# Patient Record
Sex: Female | Born: 1935 | Hispanic: No | Marital: Married | Smoking: Former smoker
Health system: Southern US, Community
[De-identification: ages and names within clinical notes are randomized; demographics above are authoritative.]

## PROBLEM LIST (undated history)

## (undated) DIAGNOSIS — C801 Malignant (primary) neoplasm, unspecified: Secondary | ICD-10-CM

## (undated) DIAGNOSIS — M1990 Unspecified osteoarthritis, unspecified site: Secondary | ICD-10-CM

## (undated) DIAGNOSIS — E039 Hypothyroidism, unspecified: Secondary | ICD-10-CM

## (undated) DIAGNOSIS — I671 Cerebral aneurysm, nonruptured: Secondary | ICD-10-CM

## (undated) DIAGNOSIS — I10 Essential (primary) hypertension: Secondary | ICD-10-CM

## (undated) DIAGNOSIS — K219 Gastro-esophageal reflux disease without esophagitis: Secondary | ICD-10-CM

## (undated) DIAGNOSIS — N3090 Cystitis, unspecified without hematuria: Secondary | ICD-10-CM

## (undated) HISTORY — PX: OTHER SURGICAL HISTORY: SHX169

## (undated) HISTORY — PX: WISDOM TOOTH EXTRACTION: SHX21

## (undated) HISTORY — PX: CHOLECYSTECTOMY: SHX55

---

## 1998-01-16 ENCOUNTER — Observation Stay (HOSPITAL_COMMUNITY): Admission: RE | Admit: 1998-01-16 | Discharge: 1998-01-17 | Payer: Self-pay | Admitting: General Surgery

## 1998-08-20 ENCOUNTER — Other Ambulatory Visit: Admission: RE | Admit: 1998-08-20 | Discharge: 1998-08-20 | Payer: Self-pay | Admitting: Internal Medicine

## 2001-11-24 ENCOUNTER — Emergency Department (HOSPITAL_COMMUNITY): Admission: EM | Admit: 2001-11-24 | Discharge: 2001-11-24 | Payer: Self-pay | Admitting: *Deleted

## 2001-12-07 ENCOUNTER — Ambulatory Visit (HOSPITAL_COMMUNITY): Admission: RE | Admit: 2001-12-07 | Discharge: 2001-12-07 | Payer: Self-pay | Admitting: Internal Medicine

## 2001-12-07 ENCOUNTER — Encounter: Payer: Self-pay | Admitting: Internal Medicine

## 2001-12-20 ENCOUNTER — Encounter: Payer: Self-pay | Admitting: Internal Medicine

## 2001-12-20 ENCOUNTER — Ambulatory Visit (HOSPITAL_COMMUNITY): Admission: RE | Admit: 2001-12-20 | Discharge: 2001-12-20 | Payer: Self-pay | Admitting: Internal Medicine

## 2002-04-21 ENCOUNTER — Ambulatory Visit (HOSPITAL_COMMUNITY): Admission: RE | Admit: 2002-04-21 | Discharge: 2002-04-21 | Payer: Self-pay | Admitting: Internal Medicine

## 2002-04-21 ENCOUNTER — Encounter: Payer: Self-pay | Admitting: Internal Medicine

## 2002-04-25 ENCOUNTER — Encounter: Admission: RE | Admit: 2002-04-25 | Discharge: 2002-04-25 | Payer: Self-pay | Admitting: Neurosurgery

## 2002-04-25 ENCOUNTER — Encounter: Payer: Self-pay | Admitting: Neurosurgery

## 2003-02-02 ENCOUNTER — Encounter (INDEPENDENT_AMBULATORY_CARE_PROVIDER_SITE_OTHER): Payer: Self-pay | Admitting: Specialist

## 2003-02-02 ENCOUNTER — Ambulatory Visit (HOSPITAL_COMMUNITY): Admission: RE | Admit: 2003-02-02 | Discharge: 2003-02-02 | Payer: Self-pay | Admitting: Gastroenterology

## 2004-07-08 ENCOUNTER — Encounter: Admission: RE | Admit: 2004-07-08 | Discharge: 2004-07-08 | Payer: Self-pay | Admitting: Internal Medicine

## 2004-07-17 ENCOUNTER — Other Ambulatory Visit: Admission: RE | Admit: 2004-07-17 | Discharge: 2004-07-17 | Payer: Self-pay | Admitting: Internal Medicine

## 2004-09-05 ENCOUNTER — Encounter: Admission: RE | Admit: 2004-09-05 | Discharge: 2004-09-05 | Payer: Self-pay | Admitting: Internal Medicine

## 2005-05-22 ENCOUNTER — Encounter: Admission: RE | Admit: 2005-05-22 | Discharge: 2005-05-22 | Payer: Self-pay | Admitting: Endocrinology

## 2006-06-24 ENCOUNTER — Encounter: Admission: RE | Admit: 2006-06-24 | Discharge: 2006-06-24 | Payer: Self-pay | Admitting: Endocrinology

## 2006-12-29 ENCOUNTER — Encounter: Admission: RE | Admit: 2006-12-29 | Discharge: 2006-12-29 | Payer: Self-pay | Admitting: Endocrinology

## 2008-02-22 ENCOUNTER — Encounter: Admission: RE | Admit: 2008-02-22 | Discharge: 2008-02-22 | Payer: Self-pay | Admitting: Endocrinology

## 2009-09-05 ENCOUNTER — Encounter: Admission: RE | Admit: 2009-09-05 | Discharge: 2009-09-05 | Payer: Self-pay | Admitting: Endocrinology

## 2010-03-30 ENCOUNTER — Ambulatory Visit (HOSPITAL_COMMUNITY)
Admission: RE | Admit: 2010-03-30 | Discharge: 2010-03-30 | Payer: Self-pay | Source: Home / Self Care | Attending: Family Medicine | Admitting: Family Medicine

## 2010-04-13 ENCOUNTER — Encounter: Payer: Self-pay | Admitting: Endocrinology

## 2010-04-23 ENCOUNTER — Encounter: Payer: Self-pay | Admitting: Cardiology

## 2010-08-08 NOTE — Op Note (Signed)
NAME:  Rachel Pierce, Rachel Pierce                       ACCOUNT NO.:  [id number]   MEDICAL RECORD NO.:  [id number]                   PATIENT TYPE:  AMB   LOCATION:  ENDO                                 FACILITY:  [facility]   PHYSICIAN:  Danise Edge, M.D.                DATE OF BIRTH:  [birth year]   DATE OF PROCEDURE:  02/02/2003  DATE OF DISCHARGE:                                 OPERATIVE REPORT   PROCEDURE:  Screening colonoscopy with sigmoid polypectomy.   INDICATIONS:  Mrs. Rachel Pierce is a 76 year old female, born June 07, 1934.  Mrs. Rachel Pierce is scheduled to undergo her first screening colonoscopy  with polypectomy to prevent colon cancer.   ENDOSCOPIST:  Danise Edge, M.D.   PREMEDICATION:  Demerol 50 mg, Versed 7 mg.   DESCRIPTION OF PROCEDURE:  After obtaining informed consent, Mrs. Rachel Pierce was  placed in the left lateral decubitus position.  I administered intravenous  Demerol and intravenous Versed to achieve conscious sedation for the  procedure.  The patient's blood pressure, oxygen saturation and cardiac  rhythm were monitored throughout the procedure and documented in the medical  record.   Anal inspection was normal.  Digital rectal exam was normal.  The Olympus  adjustable pediatric  colonoscope was introduced into the rectum and  advanced to the cecum.  Colonic preparation for the exam today was  excellent.  Rectum:  Normal.  Sigmoid colon and descending colon:  Extensive left colonic diverticulosis.  At approximately 20 cm from the anal verge, from the distal sigmoid colon, a  1 cm pedunculated polyp was removed with the electrocautery snare.  Splenic flexure:  Normal.  Transverse colon:  Normal.  Hepatic flexure:  Normal.  Ascending colon:  Normal.  Cecum and ileocecal valve:  Normal.   ASSESSMENT:  1. Extensive left colonic diverticulosis.  2. From the distal sigmoid colon, a 1 cm pedunculated polyp was removed.     Danise Edge, M.D.   MJ/MEDQ  D:  02/02/2003  T:  02/02/2003  Job:  [id number]   cc:   Ike Bene, M.D.  [address]  [place]  [place] [address]  Fax: [phone]

## 2011-02-16 ENCOUNTER — Other Ambulatory Visit: Payer: Self-pay | Admitting: Dermatology

## 2011-02-19 IMAGING — US US SOFT TISSUE HEAD/NECK
1 series · 14 of 25 positions shown · non-contrast
Comparison: 02/22/2008 and earlier.

CLINICAL DATA: 75-year-old female with history of thyroid goiter
and small nodule.

THYROID ULTRASOUND
TECHNIQUE: Ultrasound examination of the thyroid gland and adjacent
soft tissues was performed.

[Series 1: us soft tissue head/neck · 0.06mm/px · 14 of 32 slices shown]
[im 1/32]
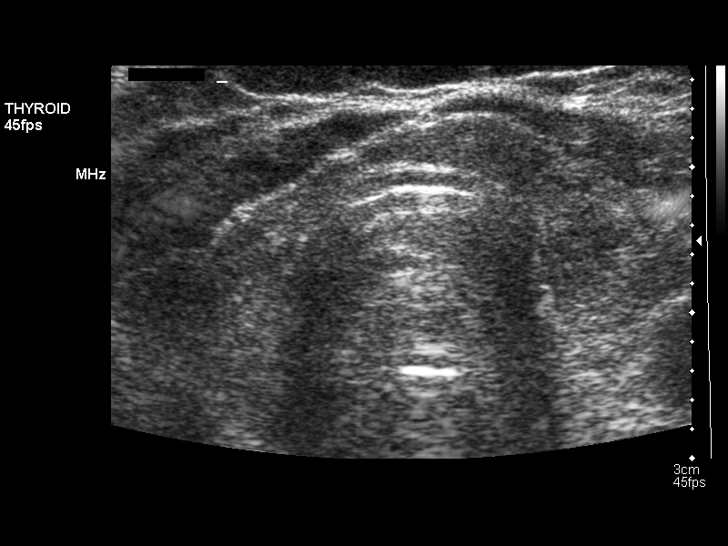
[im 3/32]
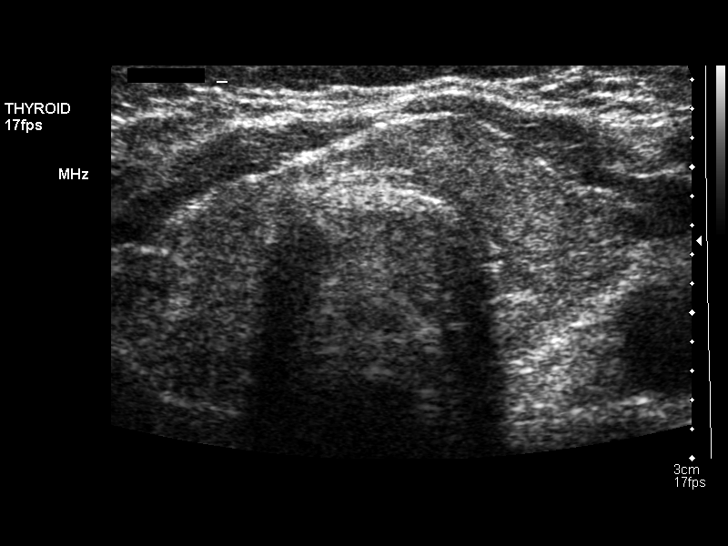
[im 6/32]
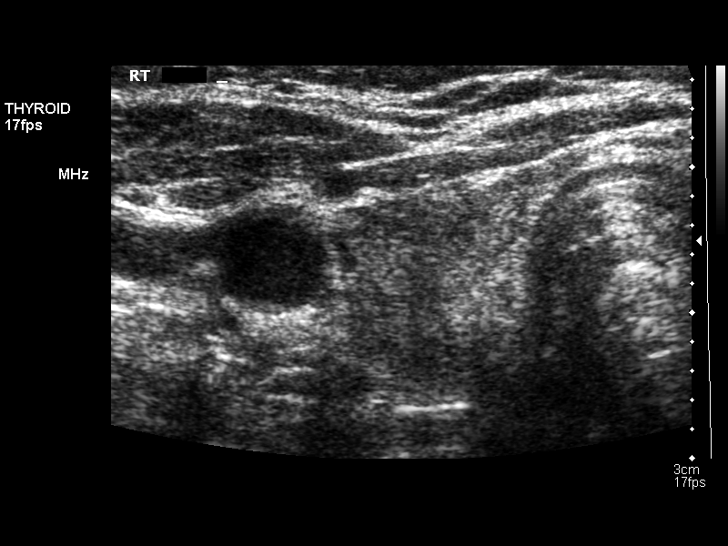
[im 8/32]
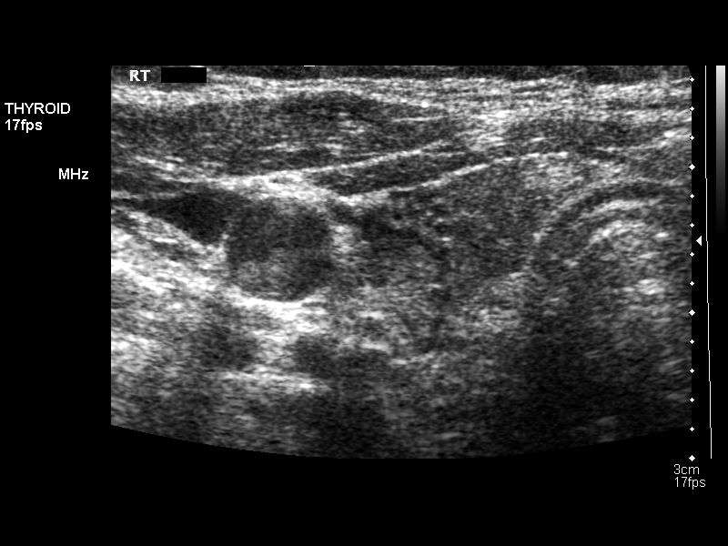
[im 11/32]
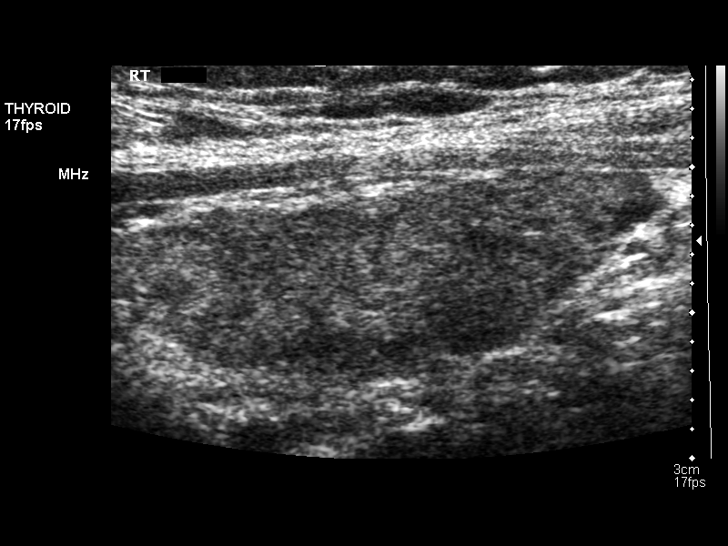
[im 12/32]
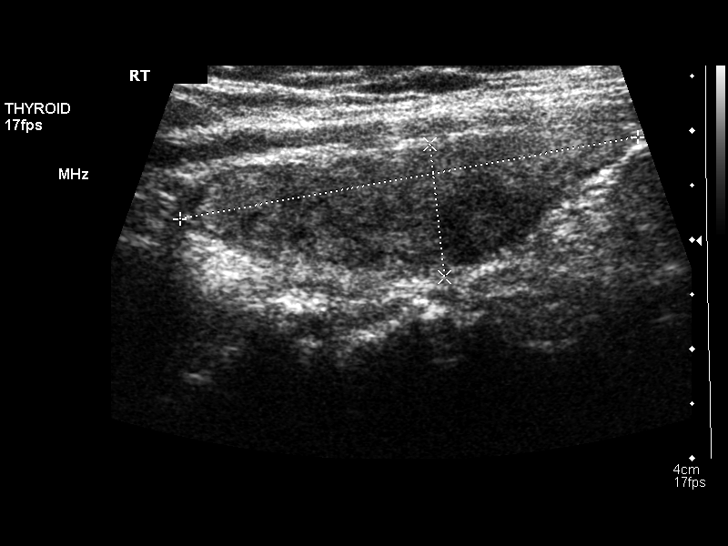
[im 15/32]
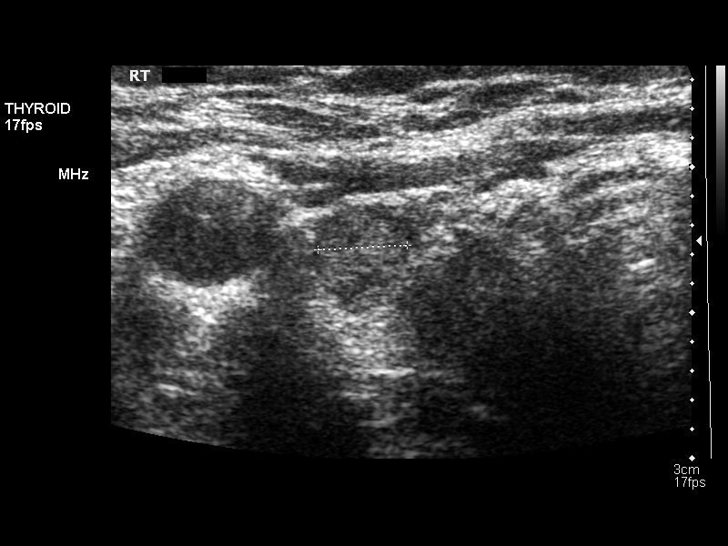
[im 17/32]
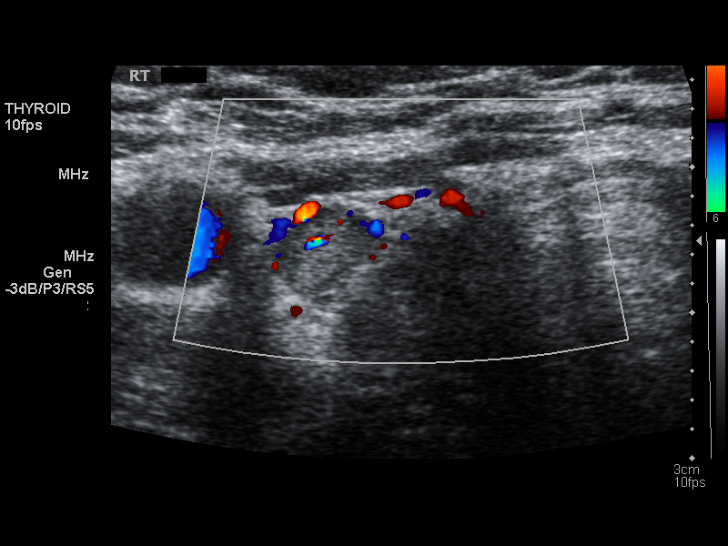
[im 20/32]
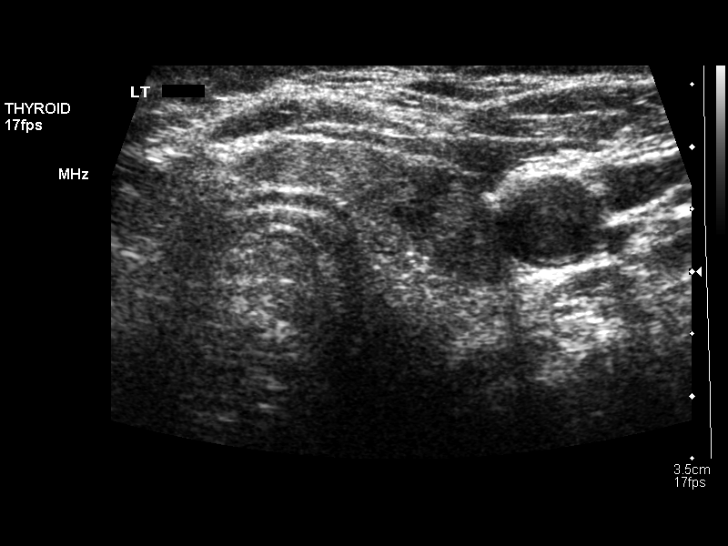
[im 21/32]
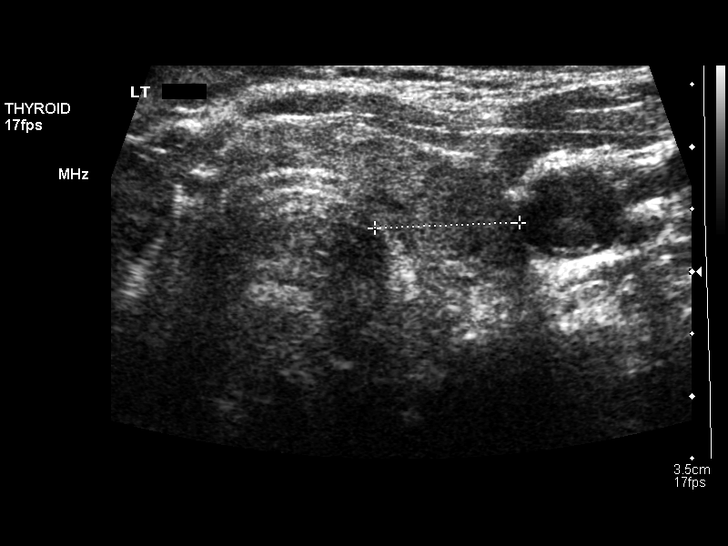
[im 24/32]
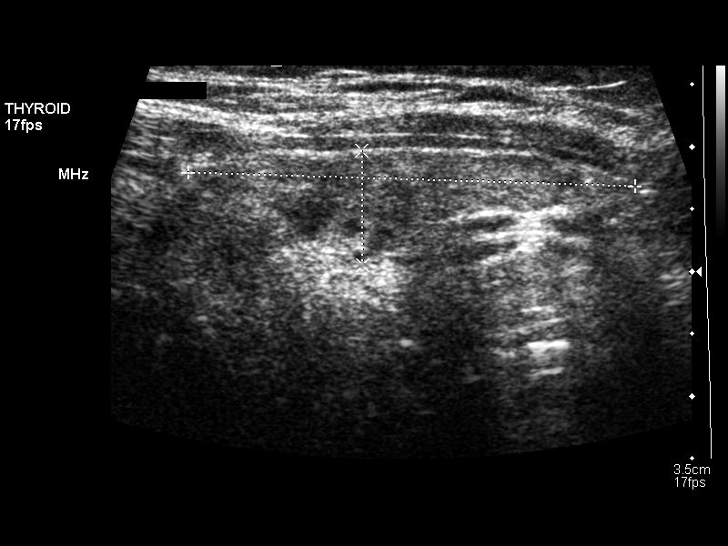
[im 26/32]
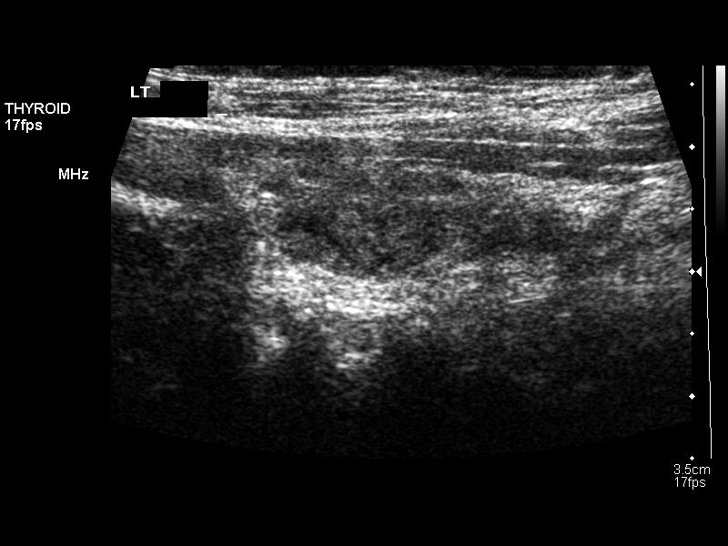
[im 29/32]
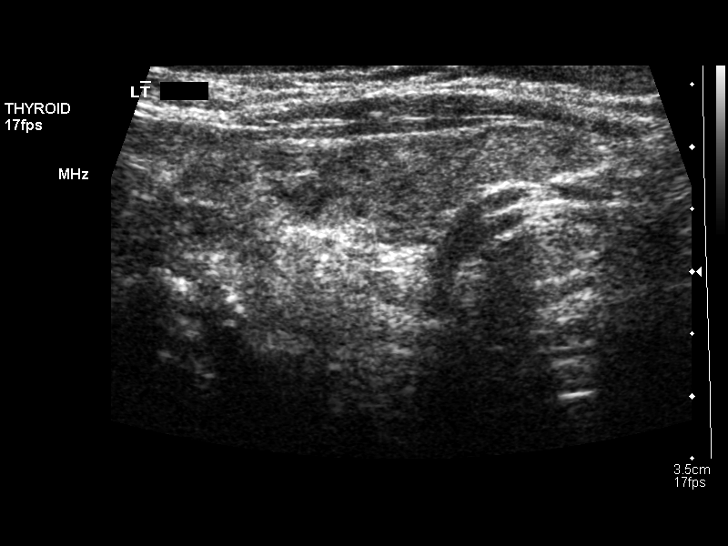
[im 32/32]
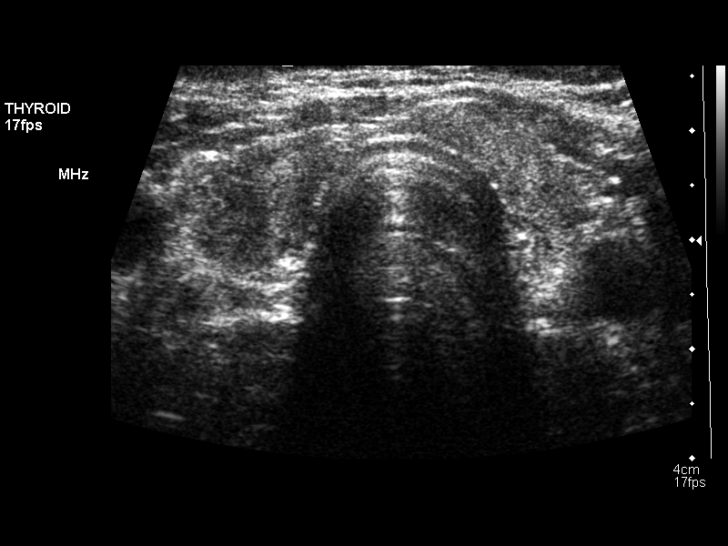

[14 of 25 positions shown; findings below may reference images not displayed]

FINDINGS: Right thyroid lobe:  4.5 x 1.3 x 1.4 cm (previously 4.5 x 1.3 x
cm).  Mildly heterogeneous echotexture.
Left thyroid lobe:  3.6 x 0.9 x 1.2 cm (previously 4.0 x 1.3 x
cm). Mildly heterogeneous echotexture.
Isthmus:  3 mm (unchanged).

Focal nodules:  Stable subtle mostly hyperechoic nodule in the
upper pole of the right lobe measuring 8 x 5 x 6 mm (previously 8 x
5 x 7 mm).

Lymphadenopathy:  Absent
IMPRESSION: Stable size and appearance of the thyroid.  No significant nodule
or mass.

## 2013-08-03 ENCOUNTER — Other Ambulatory Visit: Payer: Self-pay | Admitting: Orthopedic Surgery

## 2013-08-03 DIAGNOSIS — R531 Weakness: Secondary | ICD-10-CM

## 2013-08-03 DIAGNOSIS — M25562 Pain in left knee: Secondary | ICD-10-CM

## 2013-08-10 ENCOUNTER — Ambulatory Visit
Admission: RE | Admit: 2013-08-10 | Discharge: 2013-08-10 | Disposition: A | Discharge status: Home / Self Care | Payer: Medicare Other | Source: Ambulatory Visit | Attending: Orthopedic Surgery | Admitting: Orthopedic Surgery

## 2013-08-10 DIAGNOSIS — R531 Weakness: Secondary | ICD-10-CM

## 2013-08-10 DIAGNOSIS — M25562 Pain in left knee: Secondary | ICD-10-CM

## 2014-01-24 ENCOUNTER — Other Ambulatory Visit: Payer: Self-pay

## 2014-04-02 DIAGNOSIS — M1712 Unilateral primary osteoarthritis, left knee: Secondary | ICD-10-CM | POA: Diagnosis not present

## 2014-05-14 DIAGNOSIS — M859 Disorder of bone density and structure, unspecified: Secondary | ICD-10-CM | POA: Diagnosis not present

## 2014-05-14 DIAGNOSIS — E1121 Type 2 diabetes mellitus with diabetic nephropathy: Secondary | ICD-10-CM | POA: Diagnosis not present

## 2014-05-14 DIAGNOSIS — I10 Essential (primary) hypertension: Secondary | ICD-10-CM | POA: Diagnosis not present

## 2014-05-21 DIAGNOSIS — E1121 Type 2 diabetes mellitus with diabetic nephropathy: Secondary | ICD-10-CM | POA: Diagnosis not present

## 2014-05-21 DIAGNOSIS — I10 Essential (primary) hypertension: Secondary | ICD-10-CM | POA: Diagnosis not present

## 2014-05-21 DIAGNOSIS — N182 Chronic kidney disease, stage 2 (mild): Secondary | ICD-10-CM | POA: Diagnosis not present

## 2014-05-21 DIAGNOSIS — E785 Hyperlipidemia, unspecified: Secondary | ICD-10-CM | POA: Diagnosis not present

## 2014-07-13 DIAGNOSIS — M1712 Unilateral primary osteoarthritis, left knee: Secondary | ICD-10-CM | POA: Diagnosis not present

## 2014-07-25 ENCOUNTER — Other Ambulatory Visit (HOSPITAL_COMMUNITY): Payer: Self-pay | Admitting: Orthopedic Surgery

## 2014-07-26 ENCOUNTER — Encounter (HOSPITAL_COMMUNITY)
Admission: RE | Admit: 2014-07-26 | Discharge: 2014-07-26 | Disposition: A | Discharge status: Home / Self Care | Payer: Medicare Other | Source: Ambulatory Visit | Attending: Orthopedic Surgery | Admitting: Orthopedic Surgery

## 2014-07-26 ENCOUNTER — Encounter (HOSPITAL_COMMUNITY): Payer: Self-pay

## 2014-07-26 ENCOUNTER — Ambulatory Visit (HOSPITAL_COMMUNITY)
Admission: RE | Admit: 2014-07-26 | Discharge: 2014-07-26 | Disposition: A | Discharge status: Home / Self Care | Payer: Medicare Other | Source: Ambulatory Visit | Attending: Orthopedic Surgery | Admitting: Orthopedic Surgery

## 2014-07-26 DIAGNOSIS — M179 Osteoarthritis of knee, unspecified: Secondary | ICD-10-CM | POA: Diagnosis not present

## 2014-07-26 DIAGNOSIS — I517 Cardiomegaly: Secondary | ICD-10-CM | POA: Diagnosis not present

## 2014-07-26 DIAGNOSIS — Z85820 Personal history of malignant melanoma of skin: Secondary | ICD-10-CM | POA: Diagnosis not present

## 2014-07-26 DIAGNOSIS — Z87891 Personal history of nicotine dependence: Secondary | ICD-10-CM | POA: Diagnosis not present

## 2014-07-26 DIAGNOSIS — E039 Hypothyroidism, unspecified: Secondary | ICD-10-CM | POA: Diagnosis not present

## 2014-07-26 DIAGNOSIS — K219 Gastro-esophageal reflux disease without esophagitis: Secondary | ICD-10-CM | POA: Diagnosis not present

## 2014-07-26 DIAGNOSIS — Z01811 Encounter for preprocedural respiratory examination: Secondary | ICD-10-CM | POA: Diagnosis not present

## 2014-07-26 DIAGNOSIS — I10 Essential (primary) hypertension: Secondary | ICD-10-CM | POA: Diagnosis not present

## 2014-07-26 DIAGNOSIS — M549 Dorsalgia, unspecified: Secondary | ICD-10-CM | POA: Diagnosis not present

## 2014-07-26 DIAGNOSIS — M1710 Unilateral primary osteoarthritis, unspecified knee: Secondary | ICD-10-CM

## 2014-07-26 DIAGNOSIS — M96672 Fracture of tibia or fibula following insertion of orthopedic implant, joint prosthesis, or bone plate, left leg: Secondary | ICD-10-CM | POA: Diagnosis not present

## 2014-07-26 HISTORY — DX: Essential (primary) hypertension: I10

## 2014-07-26 HISTORY — DX: Unspecified osteoarthritis, unspecified site: M19.90

## 2014-07-26 HISTORY — DX: Hypothyroidism, unspecified: E03.9

## 2014-07-26 HISTORY — DX: Malignant (primary) neoplasm, unspecified: C80.1

## 2014-07-26 HISTORY — DX: Cystitis, unspecified without hematuria: N30.90

## 2014-07-26 HISTORY — DX: Cerebral aneurysm, nonruptured: I67.1

## 2014-07-26 HISTORY — DX: Gastro-esophageal reflux disease without esophagitis: K21.9

## 2014-07-26 LAB — CBC
HCT: 43 % (ref 36.0–46.0)
HEMOGLOBIN: 14.4 g/dL (ref 12.0–15.0)
MCH: 30.5 pg (ref 26.0–34.0)
MCHC: 33.5 g/dL (ref 30.0–36.0)
MCV: 91.1 fL (ref 78.0–100.0)
Platelets: 222 10*3/uL (ref 150–400)
RBC: 4.72 MIL/uL (ref 3.87–5.11)
RDW: 13 % (ref 11.5–15.5)
WBC: 8.3 10*3/uL (ref 4.0–10.5)

## 2014-07-26 LAB — COMPREHENSIVE METABOLIC PANEL
ALK PHOS: 66 U/L (ref 38–126)
ALT: 16 U/L (ref 14–54)
AST: 19 U/L (ref 15–41)
Albumin: 4.3 g/dL (ref 3.5–5.0)
Anion gap: 11 (ref 5–15)
BUN: 18 mg/dL (ref 6–20)
CALCIUM: 9.7 mg/dL (ref 8.9–10.3)
CO2: 24 mmol/L (ref 22–32)
Chloride: 108 mmol/L (ref 101–111)
Creatinine, Ser: 0.8 mg/dL (ref 0.44–1.00)
GLUCOSE: 96 mg/dL (ref 70–99)
Potassium: 4.2 mmol/L (ref 3.5–5.1)
Sodium: 143 mmol/L (ref 135–145)
TOTAL PROTEIN: 7.1 g/dL (ref 6.5–8.1)
Total Bilirubin: 0.4 mg/dL (ref 0.3–1.2)

## 2014-07-26 LAB — SURGICAL PCR SCREEN
MRSA, PCR: NEGATIVE
Staphylococcus aureus: POSITIVE — AB

## 2014-07-26 LAB — APTT: aPTT: 33 seconds (ref 24–37)

## 2014-07-26 LAB — PROTIME-INR
INR: 1.03 (ref 0.00–1.49)
Prothrombin Time: 13.6 seconds (ref 11.6–15.2)

## 2014-07-26 NOTE — Pre-Procedure Instructions (Signed)
Rachel Pierce  07/26/2014   Your procedure is scheduled on:    Wednesday  08/01/14  Report to [facility] Admitting at 820 AM.  Call this number if you have problems the morning of surgery: [phone]   Remember:   Do not eat food or drink liquids after midnight.   Take these medicines the morning of surgery with A SIP OF WATER:   TYLENOL IF NEEDED, LEVOTHYROXINE, METOPROLOL(TOPROL), OMEPRAZOLE (PRILOSEC)  STOP MULTIVITAMIN AND ANY HERBAL MEDICINES   Do not wear jewelry, make-up or nail polish.  Do not wear lotions, powders, or perfumes. You may wear deodorant.  Do not shave 48 hours prior to surgery. Men may shave face and neck.  Do not bring valuables to the hospital.  [facility] is not responsible                  for any belongings or valuables.               Contacts, dentures or bridgework may not be worn into surgery.  Leave suitcase in the car. After surgery it may be brought to your room.  For patients admitted to the hospital, discharge time is determined by your                treatment team.               Patients discharged the day of surgery will not be allowed to drive  home.  Name and phone number of your driver:   Special Instructions: [facility] - Preparing for Surgery  Before surgery, you can play an important role.  Because skin is not sterile, your skin needs to be as free of germs as possible.  You can reduce the number of germs on you skin by washing with CHG (chlorahexidine gluconate) soap before surgery.  CHG is an antiseptic cleaner which kills germs and bonds with the skin to continue killing germs even after washing.  Please DO NOT use if you have an allergy to CHG or antibacterial soaps.  If your skin becomes reddened/irritated stop using the CHG and inform your nurse when you arrive at Short Stay.  Do not shave (including legs and underarms) for at least 48 hours prior to the first CHG shower.  You may shave your face.  Please follow these  instructions carefully:   1.  Shower with CHG Soap the night before surgery and the                                morning of Surgery.  2.  If you choose to wash your hair, wash your hair first as usual with your       normal shampoo.  3.  After you shampoo, rinse your hair and body thoroughly to remove the                      Shampoo.  4.  Use CHG as you would any other liquid soap.  You can apply chg directly       to the skin and wash gently with scrungie or a clean washcloth.  5.  Apply the CHG Soap to your body ONLY FROM THE NECK DOWN.        Do not use on open wounds or open sores.  Avoid contact with your eyes,       ears, mouth  and genitals (private parts).  Wash genitals (private parts)       with your normal soap.  6.  Wash thoroughly, paying special attention to the area where your surgery        will be performed.  7.  Thoroughly rinse your body with warm water from the neck down.  8.  DO NOT shower/wash with your normal soap after using and rinsing off       the CHG Soap.  9.  Pat yourself dry with a clean towel.            10.  Wear clean pajamas.            11.  Place clean sheets on your bed the night of your first shower and do not        sleep with pets.  Day of Surgery  Do not apply any lotions/deoderants the morning of surgery.  Please wear clean clothes to the hospital/surgery center.     Please read over the following fact sheets that you were given: Pain Booklet, Coughing and Deep Breathing, Total Joint Packet, MRSA Information and Surgical Site Infection Prevention

## 2014-07-31 MED ORDER — CEFAZOLIN SODIUM-DEXTROSE 2-3 GM-% IV SOLR
2.0000 g | INTRAVENOUS | Status: AC
  Administered 2014-08-01: 2 g via INTRAVENOUS

## 2014-07-31 NOTE — Progress Notes (Signed)
Pt notified of time change.New arrival time of 0630.Pt verbalized understanding.

## 2014-08-01 ENCOUNTER — Inpatient Hospital Stay (HOSPITAL_COMMUNITY): Payer: Medicare Other

## 2014-08-01 ENCOUNTER — Encounter (HOSPITAL_COMMUNITY)
Admission: RE | Disposition: A | Discharge status: Home Health | Payer: Self-pay | Source: Ambulatory Visit | Attending: Orthopedic Surgery

## 2014-08-01 ENCOUNTER — Inpatient Hospital Stay (HOSPITAL_COMMUNITY): Payer: Medicare Other | Admitting: Anesthesiology

## 2014-08-01 ENCOUNTER — Inpatient Hospital Stay (HOSPITAL_COMMUNITY)
Admission: RE | Admit: 2014-08-01 | Discharge: 2014-08-04 | DRG: 470 | Disposition: A | Discharge status: Home Health | Payer: Medicare Other | Source: Ambulatory Visit | Attending: Orthopedic Surgery | Admitting: Orthopedic Surgery

## 2014-08-01 ENCOUNTER — Encounter (HOSPITAL_COMMUNITY): Payer: Self-pay | Admitting: *Deleted

## 2014-08-01 DIAGNOSIS — M96672 Fracture of tibia or fibula following insertion of orthopedic implant, joint prosthesis, or bone plate, left leg: Secondary | ICD-10-CM | POA: Diagnosis not present

## 2014-08-01 DIAGNOSIS — M179 Osteoarthritis of knee, unspecified: Secondary | ICD-10-CM | POA: Diagnosis not present

## 2014-08-01 DIAGNOSIS — Z87891 Personal history of nicotine dependence: Secondary | ICD-10-CM | POA: Diagnosis not present

## 2014-08-01 DIAGNOSIS — Z471 Aftercare following joint replacement surgery: Secondary | ICD-10-CM | POA: Diagnosis not present

## 2014-08-01 DIAGNOSIS — E039 Hypothyroidism, unspecified: Secondary | ICD-10-CM | POA: Diagnosis not present

## 2014-08-01 DIAGNOSIS — I10 Essential (primary) hypertension: Secondary | ICD-10-CM | POA: Diagnosis present

## 2014-08-01 DIAGNOSIS — Z85820 Personal history of malignant melanoma of skin: Secondary | ICD-10-CM | POA: Diagnosis not present

## 2014-08-01 DIAGNOSIS — K219 Gastro-esophageal reflux disease without esophagitis: Secondary | ICD-10-CM | POA: Diagnosis present

## 2014-08-01 DIAGNOSIS — Y838 Other surgical procedures as the cause of abnormal reaction of the patient, or of later complication, without mention of misadventure at the time of the procedure: Secondary | ICD-10-CM | POA: Diagnosis not present

## 2014-08-01 DIAGNOSIS — M1712 Unilateral primary osteoarthritis, left knee: Secondary | ICD-10-CM | POA: Diagnosis not present

## 2014-08-01 DIAGNOSIS — M549 Dorsalgia, unspecified: Secondary | ICD-10-CM | POA: Diagnosis present

## 2014-08-01 DIAGNOSIS — G8918 Other acute postprocedural pain: Secondary | ICD-10-CM | POA: Diagnosis not present

## 2014-08-01 DIAGNOSIS — Z96652 Presence of left artificial knee joint: Secondary | ICD-10-CM | POA: Diagnosis not present

## 2014-08-01 DIAGNOSIS — S82132A Displaced fracture of medial condyle of left tibia, initial encounter for closed fracture: Secondary | ICD-10-CM | POA: Diagnosis not present

## 2014-08-01 DIAGNOSIS — Z96659 Presence of unspecified artificial knee joint: Secondary | ICD-10-CM

## 2014-08-01 HISTORY — PX: TOTAL KNEE ARTHROPLASTY: SHX125

## 2014-08-01 SURGERY — ARTHROPLASTY, KNEE, TOTAL
Anesthesia: General | Laterality: Left

## 2014-08-01 MED ORDER — MIDAZOLAM HCL 5 MG/5ML IJ SOLN
INTRAMUSCULAR | Status: DC | PRN
  Administered 2014-08-01: 1 mg via INTRAVENOUS

## 2014-08-01 MED ORDER — LIDOCAINE HCL (CARDIAC) 20 MG/ML IV SOLN
INTRAVENOUS | Status: DC | PRN
  Administered 2014-08-01: 80 mg via INTRAVENOUS

## 2014-08-01 MED ORDER — LEVOTHYROXINE SODIUM 88 MCG PO TABS
88.0000 ug | ORAL_TABLET | Freq: Every day | ORAL | Status: DC
  Administered 2014-08-02 – 2014-08-04 (×3): 88 ug via ORAL
  Filled 2014-08-01 (×5): qty 1

## 2014-08-01 MED ORDER — HYDROCODONE-ACETAMINOPHEN 5-325 MG PO TABS: 1.0000 | ORAL_TABLET | Freq: Four times a day (QID) | ORAL | Status: DC | PRN

## 2014-08-01 MED ORDER — KETOROLAC TROMETHAMINE 15 MG/ML IJ SOLN
7.5000 mg | Freq: Four times a day (QID) | INTRAMUSCULAR | Status: AC
  Administered 2014-08-01 – 2014-08-02 (×4): 7.5 mg via INTRAVENOUS
  Filled 2014-08-01 (×3): qty 1

## 2014-08-01 MED ORDER — HYDROMORPHONE HCL 1 MG/ML IJ SOLN
0.2500 mg | INTRAMUSCULAR | Status: DC | PRN
  Administered 2014-08-01 (×4): 0.5 mg via INTRAVENOUS

## 2014-08-01 MED ORDER — METHOCARBAMOL 1000 MG/10ML IJ SOLN
500.0000 mg | INTRAMUSCULAR | Status: AC
  Administered 2014-08-01: 500 mg via INTRAVENOUS
  Filled 2014-08-01: qty 5

## 2014-08-01 MED ORDER — ONDANSETRON HCL 4 MG/2ML IJ SOLN
INTRAMUSCULAR | Status: DC | PRN
  Administered 2014-08-01: 4 mg via INTRAVENOUS

## 2014-08-01 MED ORDER — METOCLOPRAMIDE HCL 5 MG PO TABS
5.0000 mg | ORAL_TABLET | Freq: Three times a day (TID) | ORAL | Status: DC | PRN
  Administered 2014-08-01: 10 mg via ORAL
  Filled 2014-08-01: qty 2

## 2014-08-01 MED ORDER — FENTANYL CITRATE (PF) 250 MCG/5ML IJ SOLN
INTRAMUSCULAR | Status: AC
  Filled 2014-08-01: qty 5

## 2014-08-01 MED ORDER — SODIUM CHLORIDE 0.9 % IJ SOLN
INTRAMUSCULAR | Status: AC
Start: 2014-08-01 — End: 2014-08-01
  Filled 2014-08-01: qty 10

## 2014-08-01 MED ORDER — METOCLOPRAMIDE HCL 5 MG/ML IJ SOLN: 5.0000 mg | Freq: Three times a day (TID) | INTRAMUSCULAR | Status: DC | PRN

## 2014-08-01 MED ORDER — ROPIVACAINE HCL 5 MG/ML IJ SOLN
INTRAMUSCULAR | Status: DC | PRN
  Administered 2014-08-01: 20 mL via PERINEURAL

## 2014-08-01 MED ORDER — BUPIVACAINE LIPOSOME 1.3 % IJ SUSP
20.0000 mL | INTRAMUSCULAR | Status: AC
  Administered 2014-08-01: 20 mL
  Filled 2014-08-01: qty 20

## 2014-08-01 MED ORDER — ONDANSETRON HCL 4 MG/2ML IJ SOLN
4.0000 mg | Freq: Four times a day (QID) | INTRAMUSCULAR | Status: DC | PRN
  Administered 2014-08-01: 4 mg via INTRAVENOUS
  Filled 2014-08-01: qty 2

## 2014-08-01 MED ORDER — METOPROLOL SUCCINATE ER 25 MG PO TB24
12.5000 mg | ORAL_TABLET | Freq: Every day | ORAL | Status: DC
  Administered 2014-08-02 – 2014-08-04 (×3): 12.5 mg via ORAL
  Filled 2014-08-01 (×3): qty 1

## 2014-08-01 MED ORDER — DEXTROSE 5 % IV SOLN
500.0000 mg | Freq: Four times a day (QID) | INTRAVENOUS | Status: DC | PRN
  Filled 2014-08-01: qty 5

## 2014-08-01 MED ORDER — BISACODYL 5 MG PO TBEC: 5.0000 mg | DELAYED_RELEASE_TABLET | Freq: Every day | ORAL | Status: DC | PRN

## 2014-08-01 MED ORDER — ARTIFICIAL TEARS OP OINT
TOPICAL_OINTMENT | OPHTHALMIC | Status: DC | PRN
  Administered 2014-08-01: 1 via OPHTHALMIC

## 2014-08-01 MED ORDER — ASPIRIN EC 325 MG PO TBEC: 325.0000 mg | DELAYED_RELEASE_TABLET | Freq: Every day | ORAL | Status: DC

## 2014-08-01 MED ORDER — PANTOPRAZOLE SODIUM 40 MG PO TBEC
40.0000 mg | DELAYED_RELEASE_TABLET | Freq: Every day | ORAL | Status: DC
  Administered 2014-08-01: 40 mg via ORAL
  Filled 2014-08-01: qty 1

## 2014-08-01 MED ORDER — ONDANSETRON HCL 4 MG/2ML IJ SOLN
INTRAMUSCULAR | Status: AC
  Filled 2014-08-01: qty 2

## 2014-08-01 MED ORDER — HYDROMORPHONE HCL 1 MG/ML IJ SOLN
INTRAMUSCULAR | Status: AC
  Filled 2014-08-01: qty 1

## 2014-08-01 MED ORDER — PROMETHAZINE HCL 25 MG/ML IJ SOLN
6.2500 mg | INTRAMUSCULAR | Status: DC | PRN
Start: 2014-08-01 — End: 2014-08-01

## 2014-08-01 MED ORDER — ACETAMINOPHEN 650 MG RE SUPP: 650.0000 mg | Freq: Four times a day (QID) | RECTAL | Status: DC | PRN

## 2014-08-01 MED ORDER — KETOROLAC TROMETHAMINE 15 MG/ML IJ SOLN
INTRAMUSCULAR | Status: AC
  Filled 2014-08-01: qty 1

## 2014-08-01 MED ORDER — ROCURONIUM BROMIDE 50 MG/5ML IV SOLN
INTRAVENOUS | Status: AC
  Filled 2014-08-01: qty 1

## 2014-08-01 MED ORDER — PHENOL 1.4 % MT LIQD: 1.0000 | OROMUCOSAL | Status: DC | PRN

## 2014-08-01 MED ORDER — OXYCODONE HCL 5 MG PO TABS
5.0000 mg | ORAL_TABLET | ORAL | Status: DC | PRN
  Administered 2014-08-01 – 2014-08-04 (×13): 10 mg via ORAL
  Filled 2014-08-01 (×14): qty 2

## 2014-08-01 MED ORDER — PROPOFOL 10 MG/ML IV BOLUS
INTRAVENOUS | Status: AC
  Filled 2014-08-01: qty 20

## 2014-08-01 MED ORDER — ACETAMINOPHEN 325 MG PO TABS
650.0000 mg | ORAL_TABLET | Freq: Four times a day (QID) | ORAL | Status: DC | PRN
  Administered 2014-08-02: 650 mg via ORAL
  Filled 2014-08-01: qty 2

## 2014-08-01 MED ORDER — MIDAZOLAM HCL 2 MG/2ML IJ SOLN
INTRAMUSCULAR | Status: AC
  Filled 2014-08-01: qty 2

## 2014-08-01 MED ORDER — MENTHOL 3 MG MT LOZG: 1.0000 | LOZENGE | OROMUCOSAL | Status: DC | PRN

## 2014-08-01 MED ORDER — SUCCINYLCHOLINE CHLORIDE 20 MG/ML IJ SOLN
INTRAMUSCULAR | Status: AC
  Filled 2014-08-01: qty 1

## 2014-08-01 MED ORDER — SENNOSIDES-DOCUSATE SODIUM 8.6-50 MG PO TABS: 1.0000 | ORAL_TABLET | Freq: Every evening | ORAL | Status: DC | PRN

## 2014-08-01 MED ORDER — EPHEDRINE SULFATE 50 MG/ML IJ SOLN
INTRAMUSCULAR | Status: DC | PRN
  Administered 2014-08-01: 5 mg via INTRAVENOUS
  Administered 2014-08-01: 10 mg via INTRAVENOUS
  Administered 2014-08-01: 5 mg via INTRAVENOUS

## 2014-08-01 MED ORDER — EPHEDRINE SULFATE 50 MG/ML IJ SOLN
INTRAMUSCULAR | Status: AC
  Filled 2014-08-01: qty 1

## 2014-08-01 MED ORDER — LIDOCAINE HCL (CARDIAC) 20 MG/ML IV SOLN
INTRAVENOUS | Status: AC
  Filled 2014-08-01: qty 5

## 2014-08-01 MED ORDER — HYDROMORPHONE HCL 1 MG/ML IJ SOLN
1.0000 mg | INTRAMUSCULAR | Status: DC | PRN
  Administered 2014-08-02 – 2014-08-03 (×5): 1 mg via INTRAVENOUS
  Filled 2014-08-01 (×5): qty 1

## 2014-08-01 MED ORDER — ASPIRIN EC 325 MG PO TBEC
325.0000 mg | DELAYED_RELEASE_TABLET | Freq: Every day | ORAL | Status: DC
  Administered 2014-08-02 – 2014-08-04 (×3): 325 mg via ORAL
  Filled 2014-08-01 (×3): qty 1

## 2014-08-01 MED ORDER — OXYCODONE-ACETAMINOPHEN 5-325 MG PO TABS: 1.0000 | ORAL_TABLET | ORAL | Status: DC | PRN

## 2014-08-01 MED ORDER — SODIUM CHLORIDE 0.9 % IJ SOLN
INTRAMUSCULAR | Status: AC
  Filled 2014-08-01: qty 10

## 2014-08-01 MED ORDER — ONDANSETRON HCL 4 MG PO TABS
4.0000 mg | ORAL_TABLET | Freq: Four times a day (QID) | ORAL | Status: DC | PRN
  Administered 2014-08-02: 4 mg via ORAL
  Filled 2014-08-01 (×2): qty 1

## 2014-08-01 MED ORDER — ARTIFICIAL TEARS OP OINT
TOPICAL_OINTMENT | OPHTHALMIC | Status: AC
  Filled 2014-08-01: qty 3.5

## 2014-08-01 MED ORDER — TRANEXAMIC ACID 1000 MG/10ML IV SOLN
2000.0000 mg | INTRAVENOUS | Status: AC
  Administered 2014-08-01: 2000 mg via TOPICAL
  Filled 2014-08-01: qty 20

## 2014-08-01 MED ORDER — GLYCOPYRROLATE 0.2 MG/ML IJ SOLN
0.1000 mg | Freq: Once | INTRAMUSCULAR | Status: AC
  Administered 2014-08-01: 0.1 mg via INTRAVENOUS

## 2014-08-01 MED ORDER — PROPOFOL 10 MG/ML IV BOLUS
INTRAVENOUS | Status: DC | PRN
  Administered 2014-08-01: 190 mg via INTRAVENOUS

## 2014-08-01 MED ORDER — SODIUM CHLORIDE 0.9 % IR SOLN
Status: DC | PRN
  Administered 2014-08-01: 1000 mL

## 2014-08-01 MED ORDER — LACTATED RINGERS IV SOLN
INTRAVENOUS | Status: DC | PRN
  Administered 2014-08-01: 08:00:00 via INTRAVENOUS

## 2014-08-01 MED ORDER — GLYCOPYRROLATE 0.2 MG/ML IJ SOLN
INTRAMUSCULAR | Status: AC
  Filled 2014-08-01: qty 1

## 2014-08-01 MED ORDER — 0.9 % SODIUM CHLORIDE (POUR BTL) OPTIME
TOPICAL | Status: DC | PRN
  Administered 2014-08-01: 1000 mL

## 2014-08-01 MED ORDER — MAGNESIUM CITRATE PO SOLN: 1.0000 | Freq: Once | ORAL | Status: AC | PRN

## 2014-08-01 MED ORDER — METHOCARBAMOL 500 MG PO TABS
500.0000 mg | ORAL_TABLET | Freq: Four times a day (QID) | ORAL | Status: DC | PRN
  Administered 2014-08-02 – 2014-08-03 (×3): 500 mg via ORAL
  Filled 2014-08-01 (×3): qty 1

## 2014-08-01 MED ORDER — PRAVASTATIN SODIUM 20 MG PO TABS
20.0000 mg | ORAL_TABLET | Freq: Every day | ORAL | Status: DC
  Administered 2014-08-02 – 2014-08-03 (×2): 20 mg via ORAL
  Filled 2014-08-01 (×2): qty 1

## 2014-08-01 MED ORDER — FENTANYL CITRATE (PF) 100 MCG/2ML IJ SOLN
INTRAMUSCULAR | Status: DC | PRN
  Administered 2014-08-01 (×6): 25 ug via INTRAVENOUS
  Administered 2014-08-01 (×2): 50 ug via INTRAVENOUS

## 2014-08-01 MED ORDER — CEFAZOLIN SODIUM 1-5 GM-% IV SOLN
1.0000 g | Freq: Four times a day (QID) | INTRAVENOUS | Status: AC
  Administered 2014-08-01 – 2014-08-02 (×2): 1 g via INTRAVENOUS
  Filled 2014-08-01 (×2): qty 50

## 2014-08-01 MED ORDER — DOCUSATE SODIUM 100 MG PO CAPS
100.0000 mg | ORAL_CAPSULE | Freq: Two times a day (BID) | ORAL | Status: DC
  Administered 2014-08-01 – 2014-08-04 (×7): 100 mg via ORAL
  Filled 2014-08-01 (×7): qty 1

## 2014-08-01 MED ORDER — PHENYLEPHRINE 40 MCG/ML (10ML) SYRINGE FOR IV PUSH (FOR BLOOD PRESSURE SUPPORT)
PREFILLED_SYRINGE | INTRAVENOUS | Status: AC
  Filled 2014-08-01: qty 10

## 2014-08-01 MED ORDER — SODIUM CHLORIDE 0.9 % IV SOLN
INTRAVENOUS | Status: DC
Start: 2014-08-01 — End: 2014-08-04
  Administered 2014-08-01: 16:00:00 via INTRAVENOUS

## 2014-08-01 SURGICAL SUPPLY — 58 items
BIT DRILL CANN LRG QC 5X300 (BIT) ×3 IMPLANT
BLADE SAG 18X100X1.27 (BLADE) ×3 IMPLANT
BLADE SAGITTAL 25.0X1.27X90 (BLADE) ×2 IMPLANT
BLADE SAGITTAL 25.0X1.27X90MM (BLADE) ×1
BLADE SURG 21 STRL SS (BLADE) ×6 IMPLANT
BNDG COHESIVE 6X5 TAN STRL LF (GAUZE/BANDAGES/DRESSINGS) ×9 IMPLANT
BONE CEMENT PALACOSE (Orthopedic Implant) ×6 IMPLANT
BOWL SMART MIX CTS (DISPOSABLE) ×3 IMPLANT
CAPT KNEE TOTAL 3 ATTUNE ×3 IMPLANT
CEMENT BONE PALACOSE (Orthopedic Implant) ×2 IMPLANT
COVER SURGICAL LIGHT HANDLE (MISCELLANEOUS) ×3 IMPLANT
CUFF TOURNIQUET SINGLE 34IN LL (TOURNIQUET CUFF) ×3 IMPLANT
CUFF TOURNIQUET SINGLE 44IN (TOURNIQUET CUFF) IMPLANT
DRAPE EXTREMITY T 121X128X90 (DRAPE) ×3 IMPLANT
DRAPE IMP U-DRAPE 54X76 (DRAPES) ×3 IMPLANT
DRAPE PROXIMA HALF (DRAPES) ×3 IMPLANT
DRAPE U-SHAPE 47X51 STRL (DRAPES) ×3 IMPLANT
DRSG ADAPTIC 3X8 NADH LF (GAUZE/BANDAGES/DRESSINGS) ×3 IMPLANT
DRSG PAD ABDOMINAL 8X10 ST (GAUZE/BANDAGES/DRESSINGS) ×3 IMPLANT
DURAPREP 26ML APPLICATOR (WOUND CARE) ×3 IMPLANT
ELECT REM PT RETURN 9FT ADLT (ELECTROSURGICAL) ×3
ELECTRODE REM PT RTRN 9FT ADLT (ELECTROSURGICAL) ×1 IMPLANT
FACESHIELD WRAPAROUND (MASK) ×3 IMPLANT
GAUZE SPONGE 4X4 12PLY STRL (GAUZE/BANDAGES/DRESSINGS) ×3 IMPLANT
GLOVE BIOGEL PI IND STRL 9 (GLOVE) ×2 IMPLANT
GLOVE BIOGEL PI INDICATOR 9 (GLOVE) ×4
GLOVE SURG ORTHO 9.0 STRL STRW (GLOVE) ×3 IMPLANT
GOWN STRL REUS W/ TWL XL LVL3 (GOWN DISPOSABLE) ×3 IMPLANT
GOWN STRL REUS W/TWL XL LVL3 (GOWN DISPOSABLE) ×6
GUIDEWIRE THREADED 2.8 (WIRE) ×3 IMPLANT
HANDPIECE INTERPULSE COAX TIP (DISPOSABLE) ×2
KIT BASIN OR (CUSTOM PROCEDURE TRAY) ×3 IMPLANT
KIT ROOM TURNOVER OR (KITS) ×3 IMPLANT
MANIFOLD NEPTUNE II (INSTRUMENTS) ×3 IMPLANT
NEEDLE SPNL 18GX3.5 QUINCKE PK (NEEDLE) ×3 IMPLANT
NS IRRIG 1000ML POUR BTL (IV SOLUTION) ×3 IMPLANT
PACK TOTAL JOINT (CUSTOM PROCEDURE TRAY) ×3 IMPLANT
PACK UNIVERSAL I (CUSTOM PROCEDURE TRAY) ×3 IMPLANT
PAD ABD 8X10 STRL (GAUZE/BANDAGES/DRESSINGS) ×3 IMPLANT
PAD ARMBOARD 7.5X6 YLW CONV (MISCELLANEOUS) ×3 IMPLANT
PADDING CAST COTTON 6X4 STRL (CAST SUPPLIES) ×3 IMPLANT
SCREW 6.5X40MM (Screw) ×2 IMPLANT
SCREW 6.5X60MM (Screw) ×3 IMPLANT
SCREW BN 40X16X6.5X7.5X (Screw) ×1 IMPLANT
SET HNDPC FAN SPRY TIP SCT (DISPOSABLE) ×1 IMPLANT
SPONGE LAP 18X18 X RAY DECT (DISPOSABLE) ×3 IMPLANT
STAPLER VISISTAT 35W (STAPLE) IMPLANT
SUCTION FRAZIER TIP 10 FR DISP (SUCTIONS) IMPLANT
SUT VIC AB 0 CTB1 27 (SUTURE) IMPLANT
SUT VIC AB 1 CTX 36 (SUTURE)
SUT VIC AB 1 CTX36XBRD ANBCTR (SUTURE) IMPLANT
SYR 50ML LL SCALE MARK (SYRINGE) ×2 IMPLANT
SYRINGE 60CC LL (MISCELLANEOUS) ×3 IMPLANT
TOWEL OR 17X24 6PK STRL BLUE (TOWEL DISPOSABLE) ×3 IMPLANT
TOWEL OR 17X26 10 PK STRL BLUE (TOWEL DISPOSABLE) ×3 IMPLANT
TRAY FOLEY CATH 16FRSI W/METER (SET/KITS/TRAYS/PACK) IMPLANT
WATER STERILE IRR 1000ML POUR (IV SOLUTION) IMPLANT
WRAP KNEE MAXI GEL POST OP (GAUZE/BANDAGES/DRESSINGS) ×3 IMPLANT

## 2014-08-01 NOTE — Progress Notes (Signed)
Orthopedic Tech Progress Note Patient Details:  Rachel Pierce [birth year] [id number] Delivered Bone Foam to pt.'s nurse. Ortho Devices Type of Ortho Device: Other (comment) Ortho Device/Splint Interventions: Other (comment)   Darrol Poke 08/01/2014, 1:21 PM

## 2014-08-01 NOTE — Transfer of Care (Signed)
Immediate Anesthesia Transfer of Care Note  Patient: Rachel Pierce  Procedure(s) Performed: Procedure(s): TOTAL KNEE ARTHROPLASTY (Left)  Patient Location: PACU  Anesthesia Type:General  Level of Consciousness: awake, alert  and oriented  Airway & Oxygen Therapy: Patient Spontanous Breathing and Patient connected to nasal cannula oxygen  Post-op Assessment: Report given to RN and Post -op Vital signs reviewed and stable  Post vital signs: Reviewed and stable  Last Vitals:  Filed Vitals:   08/01/14 1027  BP:   Pulse:   Temp: 36.4 C  Resp:     Complications: No apparent anesthesia complications

## 2014-08-01 NOTE — Anesthesia Procedure Notes (Addendum)
Anesthesia Regional Block:  Femoral nerve block  Pre-Anesthetic Checklist: ,, timeout performed, Correct Patient, Correct Site, Correct Laterality, Correct Procedure, Correct Position, site marked, Risks and benefits discussed,  Surgical consent,  Pre-op evaluation,  At surgeon's request and post-op pain management  Laterality: Left  Prep: chloraprep       Needles:  Injection technique: Single-shot  Needle Type: Echogenic Stimulator Needle     Needle Length: 9cm 9 cm Needle Gauge: 21 and 21 G    Additional Needles:  Procedures: ultrasound guided (picture in chart) Femoral nerve block Narrative:  Injection made incrementally with aspirations every 5 mL.  Performed by: Personally   Additional Notes: Patient tolerated the procedure well without complications   Procedure Name: LMA Insertion Date/Time: 08/01/2014 8:34 AM Performed by: Susa Loffler Pre-anesthesia Checklist: Patient identified, Emergency Drugs available, Suction available, Patient being monitored and Timeout performed Patient Re-evaluated:Patient Re-evaluated prior to inductionOxygen Delivery Method: Circle system utilized Preoxygenation: Pre-oxygenation with 100% oxygen Intubation Type: IV induction LMA: LMA inserted LMA Size: 4.0 Number of attempts: 1 Placement Confirmation: positive ETCO2 and breath sounds checked- equal and bilateral Tube secured with: Tape Dental Injury: Teeth and Oropharynx as per pre-operative assessment

## 2014-08-01 NOTE — Op Note (Signed)
08/01/2014  10:12 AM  PATIENT:  Rachel Pierce    PRE-OPERATIVE DIAGNOSIS:  Osteoarthritis left knee  POST-OPERATIVE DIAGNOSIS:  Same  PROCEDURE:  TOTAL KNEE ARTHROPLASTY. Internal fixation of tibial plateau fracture with 6.5 cannulated screw.  SURGEON:  Newt Minion, MD  PHYSICIAN ASSISTANT:None ANESTHESIA:   General  PREOPERATIVE INDICATIONS:  Rachel Pierce is a  80 y.o. female with a diagnosis of Osteoarthritis left knee who failed conservative measures and elected for surgical management.    The risks benefits and alternatives were discussed with the patient preoperatively including but not limited to the risks of infection, bleeding, nerve injury, cardiopulmonary complications, the need for revision surgery, among others, and the patient was willing to proceed.  OPERATIVE IMPLANTS: [place] implants with size 3 femur. Size 3 tibia. 10 mm polyethylene tray. 29 mm patella. Injections include trans-Amick acid topically and popliteal fossa injection with liposomal Marcaine.  OPERATIVE FINDINGS: Fracture through the tibial plateau stabilized with 6.5 cannulated screw.   OPERATIVE PROCEDURE: Patient was brought to the operating room after undergoing a femoral block she then underwent a general anesthetic. After adequate levels anesthesia were obtained patient's left lower extremity was prepped using DuraPrep draped into a sterile field. Charlie Pitter was used to cover all exposed skin. A timeout was called. A midline incision was made this was carried down to medial parapatellar retinacular incision. Intramedullary guide was used to resect 9 mm off the distal femur. External alignment was used to resect 9 mm off the proximal tibia with a 3 posterior slope neutral varus and valgus alignment. Attention was then focused in the femur and box cuts were made for the size 3 femur there was no notching. This was set for 3 external rotation. Trial femoral and tibial components showed 10 mm of a tibial  tray had stable full extension and good flexion. The tray rotation was marked. Attention was then focused on the tibia. The keel punch was then made for the size 3 tibia. The wound was irrigated with normal saline liposomal bupivacaine was injected into the popliteal fossa care was not to have an intravascular injection. The trans-Amick acid was left to sit in the knee wall the cement was being mixed. The knee was irrigated throughout the case with pulsatile lavage. The tibial tray was first inserted injuring insertion of the tibial tray this caused a crack in the tibial plateau. This was stabilized with the 6.5 cannulated headless screw this provided stable alignment of the tibial plateau. The femoral component was cemented and the patella cemented the knee was left in extension until the cement had hardened. The tibial tray was then placed after further pulsatile irrigation. The knee was placed through a full range of motion. The patella was resurfaced and 10 mm was taken off the patella and the knee tracked well with the patella 29 mm. There was no subluxation of patella. The knee was again irrigated with normal saline retinaculum was closed using #1 Vicryl. Subcutaneous is closed using 0 Vicryl. Skin was closed using staples. A sterile compressive dressing was applied. Patient was extubated taken to the PACU in stable condition.

## 2014-08-01 NOTE — H&P (Signed)
TOTAL KNEE ADMISSION H&P  Patient is being admitted for left total knee arthroplasty.  Subjective:  Chief Complaint:left knee pain.  HPI: Rachel Pierce, 80 y.o. female, has a history of pain and functional disability in the left knee due to arthritis and has failed non-surgical conservative treatments for greater than 12 weeks to includeNSAID's and/or analgesics, use of assistive devices, weight reduction as appropriate and activity modification.  Onset of symptoms was gradual, starting 8 years ago with gradually worsening course since that time. The patient noted no past surgery on the left knee(s).  Patient currently rates pain in the left knee(s) at 8 out of 10 with activity. Patient has night pain, worsening of pain with activity and weight bearing, pain that interferes with activities of daily living, pain with passive range of motion, crepitus and joint swelling.  Patient has evidence of subchondral cysts, subchondral sclerosis, periarticular osteophytes, joint subluxation and joint space narrowing by imaging studies. This patient has had avascular necrosis of the knee. There is no active infection.  There are no active problems to display for this patient.  Past Medical History  Diagnosis Date  . Hypothyroidism   . Hypertension   . Cerebral aneurysm     X2   "PROBABLY WAS BORN WITH THEM, THEY ARE TINY"  . Cystitis   . GERD (gastroesophageal reflux disease)   . Arthritis   . Cancer     40 YRS AGO MALIGNANT MELANOMA LEG+ LT EAR    Past Surgical History  Procedure Laterality Date  . Skin melanomas removed    . Cholecystectomy    . Wisdom tooth extraction      1 WEEK AGO REMOVED    Prescriptions prior to admission  Medication Sig Dispense Refill Last Dose  . acetaminophen (TYLENOL) 500 MG tablet Take 1,000 mg by mouth 2 (two) times daily as needed (pain).   07/31/2014 at Unknown time  . acetaminophen (TYLENOL) 650 MG CR tablet Take 1,300 mg by mouth daily as needed (arthritis  pain).   07/31/2014 at Unknown time  . ARTIFICIAL TEAR SOLUTION OP Place 1 drop into the right eye daily as needed (dryness).   Past Week at Unknown time  . diphenhydrAMINE (BENADRYL) 25 MG tablet Take 25 mg by mouth 2 (two) times daily as needed for itching or allergies.   07/31/2014 at Unknown time  . levothyroxine (SYNTHROID, LEVOTHROID) 88 MCG tablet Take 1 tablet by mouth daily before breakfast.   1 08/01/2014 at 0500  . metoprolol succinate (TOPROL-XL) 25 MG 24 hr tablet Take 12.5 mg by mouth daily.  1 08/01/2014 at 0500  . Misc Natural Products (OSTEO BI-FLEX JOINT SHIELD) TABS Take 2 tablets by mouth daily.   2 wks ago  . Multiple Vitamins-Minerals (MULTIVITAMIN ADULT) TABS Take 1 tablet by mouth daily.   2 wks ago  . omeprazole (PRILOSEC) 20 MG capsule Take 20 mg by mouth daily.  1 07/31/2014 at Unknown time  . pravastatin (PRAVACHOL) 20 MG tablet Take 20 mg by mouth daily.  1 08/01/2014 at 0500  . vitamin B-12 (CYANOCOBALAMIN) 250 MCG tablet Take 250 mcg by mouth daily.   2wks ago   Allergies  Allergen Reactions  . Codeine Anaphylaxis and Swelling  . Latex     RASH   . Sulfa Antibiotics Anaphylaxis and Swelling    History  Substance Use Topics  . Smoking status: Former Research scientist (life sciences)  . Smokeless tobacco: Not on file  . Alcohol Use: Yes     Comment:  OCCAS    History reviewed. No pertinent family history.   Review of Systems  All other systems reviewed and are negative.   Objective:  Physical Exam  Vital signs in last 24 hours: Temp:  [97.7 F (36.5 C)] 97.7 F (36.5 C) (05/11 0641) Pulse Rate:  [76] 76 (05/11 0641) Resp:  [18] 18 (05/11 0641) BP: (164)/(64) 164/64 mmHg (05/11 0641) SpO2:  [98 %] 98 % (05/11 0641) Weight:  [65.772 kg (145 lb)] 65.772 kg (145 lb) (05/11 0641)  Labs:   Estimated body mass index is 26.96 kg/(m^2) as calculated from the following:   Height as of 07/26/14: 5' 1.5" (1.562 m).   Weight as of this encounter: 65.772 kg (145 lb).   Imaging  Review Plain radiographs demonstrate moderate degenerative joint disease of the left knee(s). The overall alignment ismild varus. The bone quality appears to be adequate for age and reported activity level.  Assessment/Plan:  End stage arthritis, left knee   The patient history, physical examination, clinical judgment of the provider and imaging studies are consistent with end stage degenerative joint disease of the left knee(s) and total knee arthroplasty is deemed medically necessary. The treatment options including medical management, injection therapy arthroscopy and arthroplasty were discussed at length. The risks and benefits of total knee arthroplasty were presented and reviewed. The risks due to aseptic loosening, infection, stiffness, patella tracking problems, thromboembolic complications and other imponderables were discussed. The patient acknowledged the explanation, agreed to proceed with the plan and consent was signed. Patient is being admitted for inpatient treatment for surgery, pain control, PT, OT, prophylactic antibiotics, VTE prophylaxis, progressive ambulation and ADL's and discharge planning. The patient is planning to be discharged to skilled nursing facility

## 2014-08-01 NOTE — Progress Notes (Signed)
Vitals obtained by Merlene NT

## 2014-08-01 NOTE — Anesthesia Postprocedure Evaluation (Signed)
  Anesthesia Post-op Note  Patient: Rachel Pierce  Procedure(s) Performed: Procedure(s) (LRB): TOTAL KNEE ARTHROPLASTY (Left)  Patient Location: PACU  Anesthesia Type: GA combined with regional for post-op pain  Level of Consciousness: awake and alert   Airway and Oxygen Therapy: Patient Spontanous Breathing  Post-op Pain: mild  Post-op Assessment: Post-op Vital signs reviewed, Patient's Cardiovascular Status Stable, Respiratory Function Stable, Patent Airway and No signs of Nausea or vomiting  Last Vitals:  Filed Vitals:   08/01/14 1100  BP:   Pulse: 87  Temp:   Resp: 24    Post-op Vital Signs: stable   Complications: No apparent anesthesia complications

## 2014-08-01 NOTE — Evaluation (Signed)
Physical Therapy Evaluation Patient Details Name: Rachel Pierce MRN: [id number] DOB: 1934-10-05 Today's Date: 08/01/2014   History of Present Illness  Pt presents for left TKA for end stage OA  Clinical Impression  Patient is s/p above surgery resulting in functional limitations due to the deficits listed below (see PT Problem List). Pt limited by nausea as well as lack of quad contraction today but anticipate that as effects of anesthesia wear off that she will progress well.  Patient will benefit from skilled PT to increase their independence and safety with mobility to allow discharge to the venue listed below.       Follow Up Recommendations Home health PT;Supervision for mobility/OOB    Equipment Recommendations  None recommended by PT    Recommendations for Other Services OT consult     Precautions / Restrictions Precautions Precautions: Knee Precaution Comments: knee locked out on bed Restrictions Weight Bearing Restrictions: Yes LLE Weight Bearing: Weight bearing as tolerated      Mobility  Bed Mobility Overal bed mobility: Needs Assistance Bed Mobility: Supine to Sit;Sit to Supine     Supine to sit: Min assist Sit to supine: Min assist   General bed mobility comments: vc's for sequence, min A for elevating trunk, min A for legs back into bed with return to supine  Transfers Overall transfer level: Needs assistance Equipment used: Rolling walker (2 wheeled) Transfers: Sit to/from [facility] Sit to Stand: Mod assist Stand pivot transfers: Min assist       General transfer comment: mod A given as pt without quad firing today so knee buckling. vc's for wt through UE's and pt able to transfer with no wt on LLE, wt through RLE and UE's. min A for pivot with RW  Ambulation/Gait             General Gait Details: NT due to pt nauseous and 0/5 quad  Stairs            Wheelchair Mobility    Modified Rankin (Stroke Patients  Only)       Balance Overall balance assessment: Needs assistance Sitting-balance support: No upper extremity supported;Feet supported Sitting balance-Leahy Scale: Good     Standing balance support: Bilateral upper extremity supported Standing balance-Leahy Scale: Poor Standing balance comment: requires UE support for standing at this point                             Pertinent Vitals/Pain Pain Assessment: 0-10 Pain Score: 7  Pain Location: left knee Pain Intervention(s): Limited activity within patient's tolerance;Monitored during session;Premedicated before session  VSS    Home Living Family/patient expects to be discharged to:: Private residence Living Arrangements: Spouse/significant other;Other relatives Available Help at Discharge: Family;Available 24 hours/day Type of Home: House Home Access: Stairs to enter;Ramped entrance Entrance Stairs-Rails: Right Entrance Stairs-Number of Steps: 3 Home Layout: One level Home Equipment: Walker - 2 wheels;Bedside commode Additional Comments: pt's husband can supervise but not help her physically but her sister is coming to stay with her    Prior Function Level of Independence: Independent               Hand Dominance        Extremity/Trunk Assessment   Upper Extremity Assessment: Defer to OT evaluation;Overall [facility] for tasks assessed           Lower Extremity Assessment: LLE deficits/detail;RLE deficits/detail RLE Deficits / Details: WFL LLE Deficits / Details:  hip flex 3/5, pt lacks knee ext and reports that she has ever since knee arthroscopy, 0/5 quad  Cervical / Trunk Assessment: Normal  Communication   Communication: No difficulties  Cognition Arousal/Alertness: Lethargic;Suspect due to medications Behavior During Therapy: [facility] for tasks assessed/performed Overall Cognitive Status: Within Functional Limits for tasks assessed                      General Comments General comments  (skin integrity, edema, etc.): found out after session that pt's sister fell and hurt her knee and is going to doctor today. So questions if she will be able to assist. However, anticipate that pt will be able to achieve supervision level to be home with husband    Exercises Total Joint Exercises Ankle Circles/Pumps: AROM;Both;10 reps;Supine Quad Sets: AROM;Both;10 reps;Supine;Limitations Quad Sets Limitations: pt unable to complete on left but understands concept and will keep working on it toinight Goniometric ROM: 15-90      Assessment/Plan    PT Assessment Patient needs continued PT services  PT Diagnosis Difficulty walking;Abnormality of gait;Acute pain   PT Problem List Decreased strength;Decreased range of motion;Decreased activity tolerance;Decreased balance;Decreased mobility;Decreased coordination;Decreased knowledge of use of DME;Decreased safety awareness;Decreased knowledge of precautions;Pain  PT Treatment Interventions DME instruction;Gait training;Stair training;Functional mobility training;Therapeutic activities;Therapeutic exercise;Balance training;Neuromuscular re-education;Patient/family education   PT Goals (Current goals can be found in the Care Plan section) Acute Rehab PT Goals Patient Stated Goal: return home PT Goal Formulation: With patient Time For Goal Achievement: 08/08/14 Potential to Achieve Goals: Good    Frequency BID   Barriers to discharge Decreased caregiver support potentially has only supervision    Co-evaluation               End of Session Equipment Utilized During Treatment: Gait belt Activity Tolerance: Patient tolerated treatment well Patient left: in bed;with call bell/phone within reach;with family/visitor present Nurse Communication: Mobility status         Time: 4742-5956 PT Time Calculation (min) (ACUTE ONLY): 35 min   Charges:   PT Evaluation $Initial PT Evaluation Tier I: 1 Procedure PT Treatments $Therapeutic  Activity: 8-22 mins   PT G Codes:      Leighton Roach, PT  Acute Rehab Services  [place], [place] 08/01/2014, 4:39 PM

## 2014-08-01 NOTE — Plan of Care (Signed)
Problem: Consults Goal: Diagnosis- Total Joint Replacement Outcome: Completed/Met Date Met:  08/01/14 Primary Total Knee Left

## 2014-08-01 NOTE — Progress Notes (Signed)
Patient ID: Rachel Pierce, female   DOB: 19-Feb-1935, 80 y.o.   MRN: [id number] Postoperative check patient is comfortable and doing well. Plan for weightbearing as tolerated. Patient wishes to go home in the next day or 2. Prescriptions on the chart for Vicodin and Percocet and aspirin.

## 2014-08-01 NOTE — Anesthesia Preprocedure Evaluation (Signed)
Anesthesia Evaluation  Patient identified by MRN, date of birth, ID band Patient awake    Reviewed: Allergy & Precautions, NPO status , Patient's Chart, lab work & pertinent test results  Airway Mallampati: II  TM Distance: >3 FB Neck ROM: Full    Dental no notable dental hx.    Pulmonary neg pulmonary ROS, former smoker,  breath sounds clear to auscultation  Pulmonary exam normal       Cardiovascular hypertension, Pt. on medications and Pt. on home beta blockers negative cardio ROS Normal cardiovascular examRhythm:Regular Rate:Normal     Neuro/Psych negative neurological ROS  negative psych ROS   GI/Hepatic Neg liver ROS, GERD-  Medicated,  Endo/Other  Hypothyroidism   Renal/GU negative Renal ROS  negative genitourinary   Musculoskeletal negative musculoskeletal ROS (+)   Abdominal   Peds negative pediatric ROS (+)  Hematology negative hematology ROS (+)   Anesthesia Other Findings   Reproductive/Obstetrics negative OB ROS                             Anesthesia Physical Anesthesia Plan  ASA: II  Anesthesia Plan: General   Post-op Pain Management:    Induction: Intravenous  Airway Management Planned: Oral ETT and LMA  Additional Equipment:   Intra-op Plan:   Post-operative Plan: Extubation in OR  Informed Consent: I have reviewed the patients History and Physical, chart, labs and discussed the procedure including the risks, benefits and alternatives for the proposed anesthesia with the patient or authorized representative who has indicated his/her understanding and acceptance.   Dental advisory given  Plan Discussed with: CRNA and Surgeon  Anesthesia Plan Comments:         Anesthesia Quick Evaluation

## 2014-08-02 MED ORDER — PANTOPRAZOLE SODIUM 40 MG PO TBEC
40.0000 mg | DELAYED_RELEASE_TABLET | Freq: Every day | ORAL | Status: DC
  Administered 2014-08-02 – 2014-08-04 (×3): 40 mg via ORAL
  Filled 2014-08-02 (×3): qty 1

## 2014-08-02 NOTE — Progress Notes (Signed)
Physical Therapy Treatment Patient Details Name: Rachel Pierce MRN: [id number] DOB: Feb 03, 1935 Today's Date: 08/02/2014    History of Present Illness Pt presents for left TKA for end stage OA    PT Comments    Patient able to walk within room this afternoon. Conitinues to have increased pain that is limiting mobility. Nurse in at end of session to give pain meds. Patient will need to work on long hall ambulation. Patient is planning to DC home saturday  Follow Up Recommendations  Home health PT;Supervision for mobility/OOB     Equipment Recommendations  None recommended by PT    Recommendations for Other Services       Precautions / Restrictions Precautions Precautions: Knee Restrictions LLE Weight Bearing: Weight bearing as tolerated    Mobility  Bed Mobility Overal bed mobility: Needs Assistance Bed Mobility: Supine to Sit;Sit to Supine     Supine to sit: Min assist;HOB elevated Sit to supine: Min assist   General bed mobility comments: use of bed rails. cues for encouragement and technique. A for LEs  Transfers Overall transfer level: Needs assistance Equipment used: Rolling walker (2 wheeled)   Sit to Stand: Min assist         General transfer comment: Min A to power up into standing and cues for safe technique. Better awarenss of transfer techniques this afternoon  Ambulation/Gait Ambulation/Gait assistance: Min assist Ambulation Distance (Feet): 30 Feet (15x2) Assistive device: Rolling walker (2 wheeled) Gait Pattern/deviations: Step-to pattern;Trunk flexed;Decreased step length - right;Decreased stance time - left;Decreased dorsiflexion - left   Gait velocity interpretation: Below normal speed for age/gender     Stairs            Wheelchair Mobility    Modified Rankin (Stroke Patients Only)       Balance                                    Cognition Arousal/Alertness: Awake/alert Behavior During Therapy: WFL for  tasks assessed/performed Overall Cognitive Status: Within Functional Limits for tasks assessed                      Exercises      General Comments        Pertinent Vitals/Pain Pain Score: 7  Pain Location: L knee Pain Descriptors / Indicators: Aching;Sore Pain Intervention(s): Monitored during session    Home Living                      Prior Function            PT Goals (current goals can now be found in the care plan section) Progress towards PT goals: Progressing toward goals    Frequency  BID    PT Plan Current plan remains appropriate    Co-evaluation             End of Session   Activity Tolerance: Patient tolerated treatment well Patient left: in bed;with call bell/phone within reach     Time: 5597-4163 PT Time Calculation (min) (ACUTE ONLY): 16 min  Charges:  $Gait Training: 8-22 mins                    G Codes:      Jacqualyn Posey 08/02/2014, 3:14 PM  08/02/2014 Jacqualyn Posey PTA [phone] pager [phone] office

## 2014-08-02 NOTE — Progress Notes (Signed)
Occupational Therapy Evaluation Patient Details Name: Rachel Pierce MRN: [id number] DOB: [birth year] Today's Date: 08/02/2014    History of Present Illness Pt presents for left TKA for end stage OA   Clinical Impression   Patient independent PTA. Patient currently functioning and an overall min>mod assist level. Patient will benefit from acute OT to increase overall independence in the areas of ADLs, functional mobility, and overall safety in order to safely discharge home with 24/7 supervision/assistance. At this time not recommending [place], will follow-acutely and make changes to discharge planning as needed and appropriately.     Follow Up Recommendations  No OT follow up;Supervision/Assistance - 24 hour    Equipment Recommendations  None recommended by OT    Recommendations for Other Services  None at this time   Precautions / Restrictions Precautions Precautions: Knee Precaution Comments: reviewed no pillow under knee Restrictions Weight Bearing Restrictions: Yes LLE Weight Bearing: Weight bearing as tolerated      Mobility Bed Mobility Overal bed mobility: Needs Assistance Bed Mobility: Supine to Sit Supine to sit: Min assist;HOB elevated  General bed mobility comments: use of bed rails. cues for encouragement and technique   Transfers Overall transfer level: Needs assistance Equipment used: Rolling walker (2 wheeled) Transfers: Sit to/from Stand Sit to Stand: Min assist General transfer comment: Cues for encouragement, technique, hand placement, and overall safety. Patient with difficulty problem solving when attempting to reach back and sit in recliner "I don't know what hand to let go of".     Balance Overall balance assessment: Needs assistance Sitting-balance support: No upper extremity supported;Feet supported Sitting balance-Leahy Scale: Good     Standing balance support: Bilateral upper extremity supported;During functional activity Standing  balance-Leahy Scale: Poor    ADL Overall ADL's : Needs assistance/impaired General ADL Comments: Patient limted by dizziness and nausea this session. Patient unable to reach LLE for LB ADLs, encouraged stretching/reaching to increase independence with this. Also educated patient on proping LLE on stool to increase independence. Patient BP=116/50 seated EOB. Patient stood and took a few steps > recliner and BP=98/57. Seated in recliner BP=106/46 with recliner slightly reclined. RN notified of BP readings.     Pertinent Vitals/Pain Pain Assessment: 0-10 (none at rest) Pain Score: 7  Pain Location: left knee after activity Pain Descriptors / Indicators: Aching Pain Intervention(s): Monitored during session;Limited activity within patient's tolerance     Hand Dominance Right   Extremity/Trunk Assessment Upper Extremity Assessment Upper Extremity Assessment: Overall WFL for tasks assessed   Lower Extremity Assessment Lower Extremity Assessment: Defer to PT evaluation   Cervical / Trunk Assessment Cervical / Trunk Assessment: Normal   Communication Communication Communication: No difficulties   Cognition Arousal/Alertness: Awake/alert Behavior During Therapy: WFL for tasks assessed/performed Overall Cognitive Status: Impaired/Different from baseline Area of Impairment: Following commands;Awareness;Problem solving Following Commands: Follows multi-step commands inconsistently   Awareness: Emergent Problem Solving: Slow processing;Difficulty sequencing;Requires verbal cues;Requires tactile cues             Home Living Family/patient expects to be discharged to:: Private residence Living Arrangements: Spouse/significant other;Other relatives Available Help at Discharge: Family;Available 24 hours/day Type of Home: House Home Access: Stairs to enter;Ramped entrance Entrance Stairs-Number of Steps: 3 Entrance Stairs-Rails: Right Home Layout: One level     Bathroom Shower/Tub:  Walk-in shower;Door   Bathroom Toilet: Handicapped height     Home Equipment: Environmental consultant - 2 wheels;Bedside commode   Additional Comments: pt's husband can supervise but not help her physically but her sister is  coming to stay with her      Prior Functioning/Environment Level of Independence: Independent     OT Diagnosis: Generalized weakness;Acute pain   OT Problem List: Decreased strength;Decreased range of motion;Decreased activity tolerance;Impaired balance (sitting and/or standing);Decreased safety awareness;Decreased knowledge of use of DME or AE;Decreased knowledge of precautions;Pain   OT Treatment/Interventions: Self-care/ADL training;Therapeutic exercise;Energy conservation;DME and/or AE instruction;Therapeutic activities;Patient/family education;Balance training    OT Goals(Current goals can be found in the care plan section) Acute Rehab OT Goals Patient Stated Goal: go home OT Goal Formulation: With patient Time For Goal Achievement: 08/09/14 Potential to Achieve Goals: Good ADL Goals Pt Will Perform Grooming: with supervision;standing Pt Will Perform Lower Body Bathing: with supervision;sit to/from stand;with adaptive equipment Pt Will Perform Lower Body Dressing: with supervision;sit to/from stand Pt Will Transfer to Toilet: with supervision;bedside commode;ambulating Pt Will Perform Tub/Shower Transfer: Shower transfer;with supervision;rolling walker;3 in 1;ambulating  OT Frequency: Min 2X/week   Barriers to D/C: None known at this time   End of Session Equipment Utilized During Treatment: Surveyor, mining Communication: Other (comment) (Patient's BP readings)  Activity Tolerance: Other (comment) (Pt limited by nausea and dizziness) Patient left: in chair;with call bell/phone within reach   Time: 9292-4462 OT Time Calculation (min): 30 min Charges:  OT General Charges $OT Visit: 1 Procedure OT Evaluation $Initial OT Evaluation Tier I: 1 Procedure OT  Treatments $Self Care/Home Management : 8-22 mins  Tiwanna Tuch , MS, OTR/L, CLT Pager: [phone]  08/02/2014, 8:57 AM

## 2014-08-02 NOTE — Progress Notes (Signed)
Subjective: Pt stable - pain ok   Objective: Vital signs in last 24 hours: Temp:  [97.2 F (36.2 C)-98.8 F (37.1 C)] 98.8 F (37.1 C) (05/12 0524) Pulse Rate:  [48-103] 86 (05/12 0524) Resp:  [8-24] 17 (05/12 0524) BP: (78-141)/(33-71) 108/56 mmHg (05/12 0603) SpO2:  [94 %-100 %] 94 % (05/12 0524)  Intake/Output from previous day: 05/11 0701 - 05/12 0700 In: 0034 [P.O.:240; I.V.:1176; IV Piggyback:100] Out: 700 [Urine:500; Blood:200] Intake/Output this shift:    Exam:  Sensation intact distally Intact pulses distally Dorsiflexion/Plantar flexion intact  Labs: No results for input(s): HGB in the last 72 hours. No results for input(s): WBC, RBC, HCT, PLT in the last 72 hours. No results for input(s): NA, K, CL, CO2, BUN, CREATININE, GLUCOSE, CALCIUM in the last 72 hours. No results for input(s): LABPT, INR in the last 72 hours.  Assessment/Plan: Plan PT and mobilization today - dc sat per dr Sharol Given   Marcene Duos SCOTT 08/02/2014, 8:12 AM

## 2014-08-02 NOTE — Progress Notes (Signed)
Physical Therapy Treatment Patient Details Name: Rachel Pierce MRN: [id number] DOB: 10/11/1934 Today's Date: 08/02/2014    History of Present Illness Pt presents for left TKA for end stage OA    PT Comments    Patient had just gotten back into bed with nursing after sitting up at end of OT session this AM. Patient more alert and appropriate this session and agreeable to therex in the bed. Complete full HEP. Will see again this afternoon to work on ambulation. Patient stated she is planning to DC home Saturday  Follow Up Recommendations  Home health PT;Supervision for mobility/OOB     Equipment Recommendations  None recommended by PT    Recommendations for Other Services       Precautions / Restrictions Precautions Precautions: Knee Precaution Comments: reviewed no pillow under knee Restrictions Weight Bearing Restrictions: Yes LLE Weight Bearing: Weight bearing as tolerated    Mobility  Bed Mobility Overal bed mobility: Needs Assistance Bed Mobility: Supine to Sit     Supine to sit: Min assist;HOB elevated     General bed mobility comments: use of bed rails. cues for encouragement and technique   Transfers Overall transfer level: Needs assistance Equipment used: Rolling walker (2 wheeled) Transfers: Sit to/from Stand Sit to Stand: Min assist         General transfer comment: Cues for encouragement, technique, hand placement, and overall safety. Patient with difficulty problem solving when attempting to reach back and sit in recliner "I don't know what hand to let go of".   Ambulation/Gait                 Stairs            Wheelchair Mobility    Modified Rankin (Stroke Patients Only)       Balance Overall balance assessment: Needs assistance Sitting-balance support: No upper extremity supported;Feet supported Sitting balance-Leahy Scale: Good     Standing balance support: Bilateral upper extremity supported;During functional  activity Standing balance-Leahy Scale: Poor                      Cognition Arousal/Alertness: Awake/alert Behavior During Therapy: WFL for tasks assessed/performed Overall Cognitive Status: Within Functional Limits for tasks assessed Area of Impairment: Following commands;Awareness;Problem solving       Following Commands: Follows multi-step commands inconsistently   Awareness: Emergent Problem Solving: Slow processing;Difficulty sequencing;Requires verbal cues;Requires tactile cues      Exercises Total Joint Exercises Ankle Circles/Pumps: AROM;Both;10 reps;Supine Quad Sets: AROM;Left Short Arc Quad: AAROM;Left;10 reps Heel Slides: AAROM;Left;10 reps Hip ABduction/ADduction: AAROM;Left;10 reps Straight Leg Raises: AAROM;Left;10 reps    General Comments        Pertinent Vitals/Pain Pain Assessment: 0-10 (none at rest) Pain Score: 7  Pain Location: L knee Pain Descriptors / Indicators: Aching;Sore Pain Intervention(s): Monitored during session;Patient requesting pain meds-RN notified    Home Living Family/patient expects to be discharged to:: Private residence Living Arrangements: Spouse/significant other;Other relatives Available Help at Discharge: Family;Available 24 hours/day Type of Home: House Home Access: Stairs to enter;Ramped entrance Entrance Stairs-Rails: Right Home Layout: One level Home Equipment: Walker - 2 wheels;Bedside commode Additional Comments: pt's husband can supervise but not help her physically but her sister is coming to stay with her    Prior Function Level of Independence: Independent          PT Goals (current goals can now be found in the care plan section) Acute Rehab PT Goals Patient Stated Goal: go  home Progress towards PT goals: Progressing toward goals    Frequency  BID    PT Plan Current plan remains appropriate    Co-evaluation             End of Session   Activity Tolerance: Patient tolerated treatment  well Patient left: in bed;with call bell/phone within reach     Time: 1030-1045 PT Time Calculation (min) (ACUTE ONLY): 15 min  Charges:  $Therapeutic Exercise: 8-22 mins                    G Codes:      Jacqualyn Posey 08/02/2014, 11:10 AM  08/02/2014 Jacqualyn Posey PTA [phone] pager [phone] office

## 2014-08-03 NOTE — Progress Notes (Signed)
Subjective: 2 Days Post-Op Procedure(s) (LRB): TOTAL KNEE ARTHROPLASTY (Left) Patient reports pain as moderate.  Working well with therapy. Objective: Vital signs in last 24 hours: Temp:  [99 F (37.2 C)-99.7 F (37.6 C)] 99.5 F (37.5 C) (05/13 0516) Pulse Rate:  [85-90] 90 (05/13 0516) Resp:  [16-17] 16 (05/13 0516) BP: (106-121)/(43-54) 120/54 mmHg (05/13 0516) SpO2:  [95 %] 95 % (05/13 0516)  Intake/Output from previous day: 05/12 0701 - 05/13 0700 In: 240.2 [I.V.:240.2] Out: -  Intake/Output this shift: Total I/O In: 240.2 [I.V.:240.2] Out: -   No results for input(s): HGB in the last 72 hours. No results for input(s): WBC, RBC, HCT, PLT in the last 72 hours. No results for input(s): NA, K, CL, CO2, BUN, CREATININE, GLUCOSE, CALCIUM in the last 72 hours. No results for input(s): LABPT, INR in the last 72 hours.  Sensation intact distally Intact pulses distally Dorsiflexion/Plantar flexion intact Compartment soft  Assessment/Plan: 2 Days Post-Op Procedure(s) (LRB): TOTAL KNEE ARTHROPLASTY (Left) Plan for discharge tomorrow  Mcarthur Rossetti 08/03/2014, 6:51 AM

## 2014-08-03 NOTE — Progress Notes (Signed)
Physical Therapy Treatment Patient Details Name: Rachel Pierce MRN: [id number] DOB: September 12, 1934 Today's Date: 08/03/2014    History of Present Illness Pt presents for left TKA for end stage OA    PT Comments    Patient requires MAX encouragement to participate in therapy. She required increased time and "cheerleading" as session went on. She pauses with small bouts of ambulation and really requires strong encouragement to continue. Patient is worried about her L knee buckling but able to SLR with little to no assistance. Patient had been in bed all day and was encouraged to sit up in the recliner for the remainder of the afternoon to increase activity and promote wakefulness.   Follow Up Recommendations  Home health PT;Supervision for mobility/OOB     Equipment Recommendations  None recommended by PT    Recommendations for Other Services       Precautions / Restrictions Precautions Precautions: Knee Precaution Comments: reviewed no pillow under knee Restrictions LLE Weight Bearing: Weight bearing as tolerated    Mobility  Bed Mobility Overal bed mobility: Needs Assistance       Supine to sit: Min assist;HOB elevated     General bed mobility comments: use of bed rails. cues for encouragement and technique. A for LLE.   Transfers Overall transfer level: Needs assistance Equipment used: Rolling walker (2 wheeled)   Sit to Stand: Min assist         General transfer comment: Min A to power up into standing and cues for safe technique.   Ambulation/Gait Ambulation/Gait assistance: Min assist Ambulation Distance (Feet): 15 Feet Assistive device: Rolling walker (2 wheeled) Gait Pattern/deviations: Step-to pattern;Decreased step length - right;Decreased stance time - left Gait velocity: VERY very slow. 15 Ft in 16 mins as patient stops between each step.  Gait velocity interpretation: Below normal speed for age/gender General Gait Details: Patient required  increased time due to anxiety and "overthinking" each step. Patient fearful of L knee buckling but there was no evidence of that and knee was very strong.    Stairs            Wheelchair Mobility    Modified Rankin (Stroke Patients Only)       Balance                                    Cognition Arousal/Alertness: Awake/alert Behavior During Therapy: Anxious Overall Cognitive Status: Within Functional Limits for tasks assessed                 General Comments: Patient very anxious regarding her mobility and increasing her pain.     Exercises Total Joint Exercises Quad Sets: AROM;Left Short Arc Quad: AAROM;Left;10 reps Heel Slides: AAROM;Left;10 reps Hip ABduction/ADduction: AAROM;Left;10 reps Straight Leg Raises: AAROM;Left;10 reps    General Comments        Pertinent Vitals/Pain Pain Score: 8  Pain Location: L knee Pain Descriptors / Indicators: Aching;Sore Pain Intervention(s): Monitored during session    Home Living                      Prior Function            PT Goals (current goals can now be found in the care plan section) Progress towards PT goals: Not progressing toward goals - comment (due to complains of pain and anxious about movement)    Frequency  BID  PT Plan Current plan remains appropriate    Co-evaluation             End of Session Equipment Utilized During Treatment: Gait belt Activity Tolerance: Patient limited by pain Patient left: in chair;with call bell/phone within reach     Time: 4975-3005 PT Time Calculation (min) (ACUTE ONLY): 32 min  Charges:  $Gait Training: 8-22 mins $Therapeutic Exercise: 8-22 mins                    G Codes:      Jacqualyn Posey 08/03/2014, 2:45 PM  08/03/2014 Jacqualyn Posey PTA [phone] pager [phone] office

## 2014-08-03 NOTE — Progress Notes (Signed)
PT Cancellation Note  Patient Details Name: Rachel Pierce MRN: [id number] DOB: 15-Mar-1935   Cancelled Treatment:    Reason Eval/Treat Not Completed: Pain limiting ability to participate. Patient refusing PT at this time due to increased pain. Exlpained that I would check on her this afternoon but that meant she would only receive one physical therapy session today.    Jacqualyn Posey 08/03/2014, 10:39 AM

## 2014-08-03 NOTE — Progress Notes (Signed)
OT Cancellation Note  Patient Details Name: DENITA LUN MRN: [id number] DOB: 1934/04/11   Cancelled Treatment:    Reason Eval/Treat Not Completed: Pain limiting ability to participate;Fatigue/lethargy limiting ability to participate. Pt. Requests session in PM if our schedule allows as she is too tired and in pain right now and requests to rest.    Janice Coffin, COTA/L 08/03/2014, 10:41 AM

## 2014-08-04 NOTE — Plan of Care (Signed)
Problem: Acute Rehab PT Goals(only PT should resolve) Goal: Pt Will Go Up/Down Stairs Outcome: Not Met (add Reason) Pt has ramped entrance at her house.

## 2014-08-04 NOTE — Care Management Note (Signed)
Case Management Note  Patient Details  Name: ANGELA VAZGUEZ MRN: [id number] Date of Birth: 17-Apr-1934  Subjective/Objective:  80 yo F underwent L TKA.                  Action/Plan: PT is recommending HHPT and supervision with mobility. No DME recommendations.   Expected Discharge Date:                  Expected Discharge Plan:  [place]  In-House Referral:     Discharge planning Services  CM Consult  Post Acute Care Choice:  Home Health Choice offered to:  Patient  DME Arranged:    DME Agency:     HH Arranged:  PT [place]:  [place]  Status of Service:  Completed, signed off  Medicare Important Message Given:    Date Medicare IM Given:    Medicare IM give by:    Date Additional Medicare IM Given:    Additional Medicare Important Message give by:     If discussed at [place] of Stay Meetings, dates discussed:    Additional Comments: met with pt to discuss d/c plan. She plans to return home with the support of her husband and sister. She has a walker, 3 in 1 BSC, W/C, a cane, and a ramp. She doesn't have a preference for a [place] agency. Provided pt with a list of [place] agencies. She prefers to use [facility] for HHPT. Conctacted [facility] and left a VM with referral.  Norina Buzzard, RN 08/04/2014, 8:48 AM

## 2014-08-04 NOTE — Care Management Note (Signed)
Case Management Note  Patient Details  Name: ATHENA BALTZ MRN: [id number] Date of Birth: February 24, 1935  Subjective/Objective:                    Action/Plan:   Expected Discharge Date: 08/04/14                  Expected Discharge Plan:  [place]  In-House Referral:     Discharge planning Services  CM Consult  Post Acute Care Choice:  Home Health Choice offered to:  Patient  DME Arranged:    DME Agency:     HH Arranged:  PT [place]:  [place]  Status of Service:  Completed, signed off  Medicare Important Message Given:    Date Medicare IM Given:    Medicare IM give by:    Date Additional Medicare IM Given:    Additional Medicare Important Message give by:     If discussed at [place] of Stay Meetings, dates discussed:    Additional Comments: received a call from [place] from [facility]. She received pt's referral.  Norina Buzzard, RN 08/04/2014, 10:00 AM

## 2014-08-04 NOTE — Discharge Summary (Signed)
Patient ID: Rachel Pierce MRN: [id number] DOB/AGE: [birth year] 78 y.o.  Admit date: 08/01/2014 Discharge date: 08/04/2014  Admission Diagnoses:  Active Problems:   Total knee replacement status   Discharge Diagnoses:  Same  Past Medical History  Diagnosis Date  . Hypothyroidism   . Hypertension   . Cerebral aneurysm     X2   "PROBABLY WAS BORN WITH THEM, THEY ARE TINY"  . Cystitis   . GERD (gastroesophageal reflux disease)   . Arthritis   . Cancer     40 YRS AGO MALIGNANT MELANOMA LEG+ LT EAR    Surgeries: Procedure(s): TOTAL KNEE ARTHROPLASTY on 08/01/2014   Consultants:  PT/OT  Discharged Condition: Improved  Hospital Course: Rachel Pierce is an 80 y.o. female who was admitted 08/01/2014 for operative treatment of<principal problem not specified>. Patient has severe unremitting pain that affects sleep, daily activities, and work/hobbies. After pre-op clearance the patient was taken to the operating room on 08/01/2014 and underwent  Procedure(s): TOTAL KNEE ARTHROPLASTY.    Patient was given perioperative antibiotics: Anti-infectives    Start     Dose/Rate Route Frequency Ordered Stop   08/01/14 1545  ceFAZolin (ANCEF) IVPB 1 g/50 mL premix     1 g 100 mL/hr over 30 Minutes Intravenous Every 6 hours 08/01/14 1543 08/02/14 0245   08/01/14 0800  ceFAZolin (ANCEF) IVPB 2 g/50 mL premix     2 g 100 mL/hr over 30 Minutes Intravenous To Surgery 07/31/14 1336 08/01/14 0840       Patient was given sequential compression devices, early ambulation, and chemoprophylaxis to prevent DVT.  Patient benefited maximally from hospital stay and there were no complications.    Recent vital signs: Patient Vitals for the past 24 hrs:  BP Temp Temp src Pulse Resp SpO2  08/04/14 0603 (!) 120/44 mmHg 98.1 F (36.7 C) - 84 16 96 %  08/03/14 1944 (!) 136/50 mmHg 99.2 F (37.3 C) Oral 99 16 97 %  08/03/14 1516 (!) 114/48 mmHg 98.5 F (36.9 C) Oral 87 16 93 %     Recent  laboratory studies: No results for input(s): WBC, HGB, HCT, PLT, NA, K, CL, CO2, BUN, CREATININE, GLUCOSE, INR, CALCIUM in the last 72 hours.  Invalid input(s): PT, 2   Discharge Medications:     Medication List    TAKE these medications        acetaminophen 650 MG CR tablet  Commonly known as:  TYLENOL  Take 1,300 mg by mouth daily as needed (arthritis pain).     acetaminophen 500 MG tablet  Commonly known as:  TYLENOL  Take 1,000 mg by mouth 2 (two) times daily as needed (pain).     ARTIFICIAL TEAR SOLUTION OP  Place 1 drop into the right eye daily as needed (dryness).     aspirin EC 325 MG tablet  Take 1 tablet (325 mg total) by mouth daily.     diphenhydrAMINE 25 MG tablet  Commonly known as:  BENADRYL  Take 25 mg by mouth 2 (two) times daily as needed for itching or allergies.     HYDROcodone-acetaminophen 5-325 MG per tablet  Commonly known as:  NORCO  Take 1 tablet by mouth every 6 (six) hours as needed.     levothyroxine 88 MCG tablet  Commonly known as:  SYNTHROID, LEVOTHROID  Take 1 tablet by mouth daily before breakfast.     metoprolol succinate 25 MG 24 hr tablet  Commonly known as:  TOPROL-XL  Take 12.5  mg by mouth daily.     MULTIVITAMIN ADULT Tabs  Take 1 tablet by mouth daily.     omeprazole 20 MG capsule  Commonly known as:  PRILOSEC  Take 20 mg by mouth daily.     OSTEO BI-FLEX JOINT SHIELD Tabs  Take 2 tablets by mouth daily.     oxyCODONE-acetaminophen 5-325 MG per tablet  Commonly known as:  ROXICET  Take 1 tablet by mouth every 4 (four) hours as needed for severe pain.     pravastatin 20 MG tablet  Commonly known as:  PRAVACHOL  Take 20 mg by mouth daily.     vitamin B-12 250 MCG tablet  Commonly known as:  CYANOCOBALAMIN  Take 250 mcg by mouth daily.        Diagnostic Studies: Dg Chest 2 View  07/26/2014   CLINICAL DATA:  Pre-admit for left knee surgery  EXAM: CHEST  2 VIEW  COMPARISON:  None.  FINDINGS: No active infiltrate or  effusion is seen. Mediastinal and hilar contours are unremarkable. The heart is mildly enlarged. No bony abnormality is seen.  IMPRESSION: No active cardiopulmonary disease.   Electronically Signed   By: Ivar Drape M.D.   On: 07/26/2014 11:47   Dg Knee 1-2 Views Left  08/01/2014   CLINICAL DATA:  Status post left knee replacement  EXAM: LEFT KNEE - 1-2 VIEW  COMPARISON:  None.  FINDINGS: Left knee prosthesis is noted. Air is seen within the surgical bed. No acute abnormality is noted.  IMPRESSION: Status post left knee   Electronically Signed   By: Inez Catalina M.D.   On: 08/01/2014 11:24    Disposition:       Discharge Instructions    Call MD / Call 911    Complete by:  As directed   If you experience chest pain or shortness of breath, CALL 911 and be transported to the hospital emergency room.  If you develope a fever above 101 F, pus (white drainage) or increased drainage or redness at the wound, or calf pain, call your surgeon's office.     Constipation Prevention    Complete by:  As directed   Drink plenty of fluids.  Prune juice may be helpful.  You may use a stool softener, such as Colace (over the counter) 100 mg twice a day.  Use MiraLax (over the counter) for constipation as needed.     Diet - low sodium heart healthy    Complete by:  As directed      Increase activity slowly as tolerated    Complete by:  As directed      Weight bearing as tolerated    Complete by:  As directed            Follow-up Information    Follow up with DUDA,MARCUS V, MD In 2 weeks.   Specialty:  Orthopedic Surgery   Contact information:   [place] [address] [phone]        Signed: Erskine Emery 08/04/2014, 8:51 AM

## 2014-08-04 NOTE — Progress Notes (Signed)
Pt is being discharged home. Pt has been provided with discharge instructions. RN answered all questions the patient had. Pt is being transported home by her husband who is at the bedside

## 2014-08-04 NOTE — Discharge Instructions (Signed)
Keep dressing clean dry and intact.

## 2014-08-04 NOTE — Progress Notes (Signed)
Subjective: 3 Days Post-Op Procedure(s) (LRB): TOTAL KNEE ARTHROPLASTY (Left) Patient reports pain as moderate.  Tolerating diet well. No complaints wants to go home today.  Objective: Vital signs in last 24 hours: Temp:  [98.1 F (36.7 C)-99.2 F (37.3 C)] 98.1 F (36.7 C) (05/14 0603) Pulse Rate:  [84-99] 84 (05/14 0603) Resp:  [16] 16 (05/14 0603) BP: (114-136)/(44-50) 120/44 mmHg (05/14 0603) SpO2:  [93 %-97 %] 96 % (05/14 0603)  Intake/Output from previous day: 05/13 0701 - 05/14 0700 In: 360 [P.O.:360] Out: -  Intake/Output this shift:    No results for input(s): HGB in the last 72 hours. No results for input(s): WBC, RBC, HCT, PLT in the last 72 hours. No results for input(s): NA, K, CL, CO2, BUN, CREATININE, GLUCOSE, CALCIUM in the last 72 hours. No results for input(s): LABPT, INR in the last 72 hours.  Sensation intact distally Intact pulses distally Dorsiflexion/Plantar flexion intact Incision: dressing C/D/I Compartment soft  Assessment/Plan: 3 Days Post-Op Procedure(s) (LRB): TOTAL KNEE ARTHROPLASTY (Left) Up with therapy Discharge home with home health  Erskine Emery 08/04/2014, 8:34 AM

## 2014-08-04 NOTE — Progress Notes (Signed)
Occupational Therapy Treatment Patient Details Name: Rachel Pierce MRN: [id number] DOB: 23-Dec-1934 Today's Date: 08/04/2014    History of present illness Pt presents for left TKA for end stage OA   OT comments  Patient making good progress toward OT goals, continue plan of care for now. Continue to recommend 24/7 supervision post acute d/c, patient states her sister and husband will be available to assist. Encouraged elevation and movement/exercise > LLE due to swelling throughout leg, educated patient on this.    Follow Up Recommendations  No OT follow up;Supervision/Assistance - 24 hour    Equipment Recommendations  None recommended by OT    Recommendations for Other Services  None at this time   Precautions / Restrictions Precautions Precautions: Knee Precaution Comments: reviewed no pillow under knee and zero knee foam Restrictions Weight Bearing Restrictions: Yes LLE Weight Bearing: Weight bearing as tolerated       Mobility Bed Mobility Overal bed mobility: Needs Assistance Bed Mobility: Supine to Sit;Sit to Supine     Supine to sit: Min assist Sit to supine: Min assist   General bed mobility comments: HOB elevated and use of rails. Patient needed assistance with LLE management getting in/out of bed.   Transfers Overall transfer level: Needs assistance Equipment used: Rolling walker (2 wheeled) Transfers: Sit to/from Stand Sit to Stand: Supervision General transfer comment: Cues for technique, hand placement, and safety.    Balance Overall balance assessment: Needs assistance Sitting-balance support: No upper extremity supported;Feet supported Sitting balance-Leahy Scale: Good     Standing balance support: Bilateral upper extremity supported;During functional activity Standing balance-Leahy Scale: Fair   ADL Overall ADL's : Needs assistance/impaired General ADL Comments: Patient continues to need assistance with LLE ADLs. Encouraged use of reacher  (that of which patient has at home). Patient states her family can assist prn, encouraged patient be as independent as she can. Patient better today and no dizziness during OT treatment, no BP concerns. Patient completed UB/LB dressing (donning a dress and donning slide on shoes). Recommend 24/7 supervision post acute d/c, patient aware and agreed with this recommendation.      Cognition   Behavior During Therapy: WFL for tasks assessed/performed Overall Cognitive Status: Within Functional Limits for tasks assessed                 Pertinent Vitals/ Pain       Pain Assessment: No/denies pain         Frequency Min 2X/week     Progress Toward Goals  OT Goals(current goals can now be found in the care plan section)  Progress towards OT goals: Progressing toward goals     Plan Discharge plan remains appropriate       End of Session Equipment Utilized During Treatment: Rolling walker   Activity Tolerance Patient tolerated treatment well   Patient Left in bed;with call bell/phone within reach    Time: 2355-7322 OT Time Calculation (min): 16 min  Charges: OT General Charges $OT Visit: 1 Procedure OT Treatments $Self Care/Home Management : 8-22 mins  Adriana Lina , MS, OTR/L, CLT Pager: [phone]  08/04/2014, 10:20 AM

## 2014-08-04 NOTE — Progress Notes (Signed)
Physical Therapy Treatment Patient Details Name: Rachel Pierce MRN: [id number] DOB: Dec 26, 1934 Today's Date: 08/04/2014    History of Present Illness Pt presents for left TKA for end stage OA    PT Comments    Plan is for d/c home today. PT to continue with HHPT.  Follow Up Recommendations  Home health PT;Supervision for mobility/OOB     Equipment Recommendations  None recommended by PT    Recommendations for Other Services       Precautions / Restrictions Precautions Precautions: Knee Precaution Comments: reviewed no pillow under knee and zero knee foam Restrictions Weight Bearing Restrictions: Yes LLE Weight Bearing: Weight bearing as tolerated    Mobility  Bed Mobility Overal bed mobility: Needs Assistance Bed Mobility: Supine to Sit;Sit to Supine     Supine to sit: Min assist Sit to supine: Min assist   General bed mobility comments: assist for LLE into bed  Transfers Overall transfer level: Needs assistance Equipment used: Rolling walker (2 wheeled) Transfers: Sit to/from Stand Sit to Stand: Supervision Stand pivot transfers: Supervision       General transfer comment: verbal cues for sequencing  Ambulation/Gait Ambulation/Gait assistance: Min guard Ambulation Distance (Feet): 50 Feet Assistive device: Rolling walker (2 wheeled) Gait Pattern/deviations: Step-to pattern;Decreased stride length;Antalgic Gait velocity: very slow Gait velocity interpretation: Below normal speed for age/gender     Stairs            Wheelchair Mobility    Modified Rankin (Stroke Patients Only)       Balance Overall balance assessment: Needs assistance Sitting-balance support: No upper extremity supported;Feet supported Sitting balance-Leahy Scale: Good     Standing balance support: Bilateral upper extremity supported;During functional activity Standing balance-Leahy Scale: Fair                      Cognition Arousal/Alertness:  Awake/alert Behavior During Therapy: WFL for tasks assessed/performed Overall Cognitive Status: Within Functional Limits for tasks assessed                      Exercises Total Joint Exercises Ankle Circles/Pumps: AROM;Both;10 reps Quad Sets: AROM;Both;5 reps Heel Slides: AAROM;Left;5 reps Hip ABduction/ADduction: AAROM;Left;5 reps Goniometric ROM: 5-50 AAROM L knee in supine    General Comments        Pertinent Vitals/Pain Pain Assessment: No/denies pain Pain Score: 5  Pain Location: L knee Pain Intervention(s): Monitored during session    Home Living                      Prior Function            PT Goals (current goals can now be found in the care plan section) Acute Rehab PT Goals Patient Stated Goal: go home PT Goal Formulation: With patient Time For Goal Achievement: 08/08/14 Potential to Achieve Goals: Good Progress towards PT goals: Progressing toward goals    Frequency  BID    PT Plan Current plan remains appropriate    Co-evaluation             End of Session Equipment Utilized During Treatment: Gait belt Activity Tolerance: Patient tolerated treatment well Patient left: in bed;with call bell/phone within reach     Time: 0937-1001 PT Time Calculation (min) (ACUTE ONLY): 24 min  Charges:  $Gait Training: 8-22 mins $Therapeutic Exercise: 8-22 mins                    G  Codes:      Lorriane Shire 08/04/2014, 10:29 AM

## 2014-08-05 DIAGNOSIS — I10 Essential (primary) hypertension: Secondary | ICD-10-CM | POA: Diagnosis not present

## 2014-08-05 DIAGNOSIS — E039 Hypothyroidism, unspecified: Secondary | ICD-10-CM | POA: Diagnosis not present

## 2014-08-05 DIAGNOSIS — Z96652 Presence of left artificial knee joint: Secondary | ICD-10-CM | POA: Diagnosis not present

## 2014-08-05 DIAGNOSIS — Z471 Aftercare following joint replacement surgery: Secondary | ICD-10-CM | POA: Diagnosis not present

## 2014-08-06 ENCOUNTER — Encounter (HOSPITAL_COMMUNITY): Payer: Self-pay | Admitting: Orthopedic Surgery

## 2014-08-07 DIAGNOSIS — Z471 Aftercare following joint replacement surgery: Secondary | ICD-10-CM | POA: Diagnosis not present

## 2014-08-07 DIAGNOSIS — E039 Hypothyroidism, unspecified: Secondary | ICD-10-CM | POA: Diagnosis not present

## 2014-08-07 DIAGNOSIS — I10 Essential (primary) hypertension: Secondary | ICD-10-CM | POA: Diagnosis not present

## 2014-08-07 DIAGNOSIS — Z96652 Presence of left artificial knee joint: Secondary | ICD-10-CM | POA: Diagnosis not present

## 2014-08-08 DIAGNOSIS — I10 Essential (primary) hypertension: Secondary | ICD-10-CM | POA: Diagnosis not present

## 2014-08-08 DIAGNOSIS — E039 Hypothyroidism, unspecified: Secondary | ICD-10-CM | POA: Diagnosis not present

## 2014-08-08 DIAGNOSIS — Z471 Aftercare following joint replacement surgery: Secondary | ICD-10-CM | POA: Diagnosis not present

## 2014-08-08 DIAGNOSIS — Z96652 Presence of left artificial knee joint: Secondary | ICD-10-CM | POA: Diagnosis not present

## 2014-08-10 ENCOUNTER — Encounter (HOSPITAL_COMMUNITY): Payer: Self-pay | Admitting: Orthopedic Surgery

## 2014-08-10 DIAGNOSIS — E039 Hypothyroidism, unspecified: Secondary | ICD-10-CM | POA: Diagnosis not present

## 2014-08-10 DIAGNOSIS — Z96652 Presence of left artificial knee joint: Secondary | ICD-10-CM | POA: Diagnosis not present

## 2014-08-10 DIAGNOSIS — Z471 Aftercare following joint replacement surgery: Secondary | ICD-10-CM | POA: Diagnosis not present

## 2014-08-10 DIAGNOSIS — I10 Essential (primary) hypertension: Secondary | ICD-10-CM | POA: Diagnosis not present

## 2014-08-13 DIAGNOSIS — E039 Hypothyroidism, unspecified: Secondary | ICD-10-CM | POA: Diagnosis not present

## 2014-08-13 DIAGNOSIS — Z471 Aftercare following joint replacement surgery: Secondary | ICD-10-CM | POA: Diagnosis not present

## 2014-08-13 DIAGNOSIS — I10 Essential (primary) hypertension: Secondary | ICD-10-CM | POA: Diagnosis not present

## 2014-08-13 DIAGNOSIS — Z96652 Presence of left artificial knee joint: Secondary | ICD-10-CM | POA: Diagnosis not present

## 2014-08-15 DIAGNOSIS — I10 Essential (primary) hypertension: Secondary | ICD-10-CM | POA: Diagnosis not present

## 2014-08-15 DIAGNOSIS — M1712 Unilateral primary osteoarthritis, left knee: Secondary | ICD-10-CM | POA: Diagnosis not present

## 2014-08-15 DIAGNOSIS — Z96652 Presence of left artificial knee joint: Secondary | ICD-10-CM | POA: Diagnosis not present

## 2014-08-15 DIAGNOSIS — S82132D Displaced fracture of medial condyle of left tibia, subsequent encounter for closed fracture with routine healing: Secondary | ICD-10-CM | POA: Diagnosis not present

## 2014-08-15 DIAGNOSIS — E039 Hypothyroidism, unspecified: Secondary | ICD-10-CM | POA: Diagnosis not present

## 2014-08-15 DIAGNOSIS — Z471 Aftercare following joint replacement surgery: Secondary | ICD-10-CM | POA: Diagnosis not present

## 2014-08-17 DIAGNOSIS — Z471 Aftercare following joint replacement surgery: Secondary | ICD-10-CM | POA: Diagnosis not present

## 2014-08-17 DIAGNOSIS — E039 Hypothyroidism, unspecified: Secondary | ICD-10-CM | POA: Diagnosis not present

## 2014-08-17 DIAGNOSIS — Z96652 Presence of left artificial knee joint: Secondary | ICD-10-CM | POA: Diagnosis not present

## 2014-08-17 DIAGNOSIS — I10 Essential (primary) hypertension: Secondary | ICD-10-CM | POA: Diagnosis not present

## 2014-11-12 DIAGNOSIS — I10 Essential (primary) hypertension: Secondary | ICD-10-CM | POA: Diagnosis not present

## 2014-11-12 DIAGNOSIS — E1121 Type 2 diabetes mellitus with diabetic nephropathy: Secondary | ICD-10-CM | POA: Diagnosis not present

## 2015-01-16 DIAGNOSIS — H52203 Unspecified astigmatism, bilateral: Secondary | ICD-10-CM | POA: Diagnosis not present

## 2015-01-16 DIAGNOSIS — H2513 Age-related nuclear cataract, bilateral: Secondary | ICD-10-CM | POA: Diagnosis not present

## 2015-01-31 DIAGNOSIS — H52201 Unspecified astigmatism, right eye: Secondary | ICD-10-CM | POA: Diagnosis not present

## 2015-01-31 DIAGNOSIS — H2511 Age-related nuclear cataract, right eye: Secondary | ICD-10-CM | POA: Diagnosis not present

## 2015-01-31 DIAGNOSIS — H25811 Combined forms of age-related cataract, right eye: Secondary | ICD-10-CM | POA: Diagnosis not present

## 2015-01-31 DIAGNOSIS — H25011 Cortical age-related cataract, right eye: Secondary | ICD-10-CM | POA: Diagnosis not present

## 2015-02-20 DIAGNOSIS — E1122 Type 2 diabetes mellitus with diabetic chronic kidney disease: Secondary | ICD-10-CM | POA: Diagnosis not present

## 2015-02-20 DIAGNOSIS — I129 Hypertensive chronic kidney disease with stage 1 through stage 4 chronic kidney disease, or unspecified chronic kidney disease: Secondary | ICD-10-CM | POA: Diagnosis not present

## 2015-02-20 DIAGNOSIS — F17211 Nicotine dependence, cigarettes, in remission: Secondary | ICD-10-CM | POA: Diagnosis not present

## 2015-02-20 DIAGNOSIS — N182 Chronic kidney disease, stage 2 (mild): Secondary | ICD-10-CM | POA: Diagnosis not present

## 2015-02-20 DIAGNOSIS — E785 Hyperlipidemia, unspecified: Secondary | ICD-10-CM | POA: Diagnosis not present

## 2015-03-18 DIAGNOSIS — H20011 Primary iridocyclitis, right eye: Secondary | ICD-10-CM | POA: Diagnosis not present

## 2015-03-20 DIAGNOSIS — H20011 Primary iridocyclitis, right eye: Secondary | ICD-10-CM | POA: Diagnosis not present

## 2015-03-27 DIAGNOSIS — L570 Actinic keratosis: Secondary | ICD-10-CM | POA: Diagnosis not present

## 2015-03-27 DIAGNOSIS — M71341 Other bursal cyst, right hand: Secondary | ICD-10-CM | POA: Diagnosis not present

## 2015-04-10 DIAGNOSIS — Z1231 Encounter for screening mammogram for malignant neoplasm of breast: Secondary | ICD-10-CM | POA: Diagnosis not present

## 2015-08-05 DIAGNOSIS — I129 Hypertensive chronic kidney disease with stage 1 through stage 4 chronic kidney disease, or unspecified chronic kidney disease: Secondary | ICD-10-CM | POA: Diagnosis not present

## 2015-08-05 DIAGNOSIS — Z23 Encounter for immunization: Secondary | ICD-10-CM | POA: Diagnosis not present

## 2015-08-05 DIAGNOSIS — M8580 Other specified disorders of bone density and structure, unspecified site: Secondary | ICD-10-CM | POA: Diagnosis not present

## 2015-08-05 DIAGNOSIS — Z0000 Encounter for general adult medical examination without abnormal findings: Secondary | ICD-10-CM | POA: Diagnosis not present

## 2015-08-05 DIAGNOSIS — Z1389 Encounter for screening for other disorder: Secondary | ICD-10-CM | POA: Diagnosis not present

## 2015-08-05 DIAGNOSIS — E1122 Type 2 diabetes mellitus with diabetic chronic kidney disease: Secondary | ICD-10-CM | POA: Diagnosis not present

## 2015-08-05 DIAGNOSIS — Z961 Presence of intraocular lens: Secondary | ICD-10-CM | POA: Diagnosis not present

## 2015-08-12 DIAGNOSIS — E785 Hyperlipidemia, unspecified: Secondary | ICD-10-CM | POA: Diagnosis not present

## 2015-08-12 DIAGNOSIS — I129 Hypertensive chronic kidney disease with stage 1 through stage 4 chronic kidney disease, or unspecified chronic kidney disease: Secondary | ICD-10-CM | POA: Diagnosis not present

## 2015-08-12 DIAGNOSIS — E039 Hypothyroidism, unspecified: Secondary | ICD-10-CM | POA: Diagnosis not present

## 2015-08-12 DIAGNOSIS — E1122 Type 2 diabetes mellitus with diabetic chronic kidney disease: Secondary | ICD-10-CM | POA: Diagnosis not present

## 2015-08-12 DIAGNOSIS — N182 Chronic kidney disease, stage 2 (mild): Secondary | ICD-10-CM | POA: Diagnosis not present

## 2015-08-12 DIAGNOSIS — F17211 Nicotine dependence, cigarettes, in remission: Secondary | ICD-10-CM | POA: Diagnosis not present

## 2015-08-20 DIAGNOSIS — M25562 Pain in left knee: Secondary | ICD-10-CM | POA: Diagnosis not present

## 2015-08-23 DIAGNOSIS — M25562 Pain in left knee: Secondary | ICD-10-CM | POA: Diagnosis not present

## 2015-08-27 DIAGNOSIS — M25562 Pain in left knee: Secondary | ICD-10-CM | POA: Diagnosis not present

## 2015-08-27 DIAGNOSIS — Z1211 Encounter for screening for malignant neoplasm of colon: Secondary | ICD-10-CM | POA: Diagnosis not present

## 2015-08-27 DIAGNOSIS — Z1212 Encounter for screening for malignant neoplasm of rectum: Secondary | ICD-10-CM | POA: Diagnosis not present

## 2015-09-06 DIAGNOSIS — L820 Inflamed seborrheic keratosis: Secondary | ICD-10-CM | POA: Diagnosis not present

## 2015-12-03 DIAGNOSIS — Z85820 Personal history of malignant melanoma of skin: Secondary | ICD-10-CM | POA: Diagnosis not present

## 2015-12-03 DIAGNOSIS — L940 Localized scleroderma [morphea]: Secondary | ICD-10-CM | POA: Diagnosis not present

## 2015-12-03 DIAGNOSIS — C44519 Basal cell carcinoma of skin of other part of trunk: Secondary | ICD-10-CM | POA: Diagnosis not present

## 2015-12-03 DIAGNOSIS — D225 Melanocytic nevi of trunk: Secondary | ICD-10-CM | POA: Diagnosis not present

## 2015-12-03 DIAGNOSIS — L821 Other seborrheic keratosis: Secondary | ICD-10-CM | POA: Diagnosis not present

## 2015-12-20 DIAGNOSIS — Z23 Encounter for immunization: Secondary | ICD-10-CM | POA: Diagnosis not present

## 2016-01-09 IMAGING — CR DG CHEST 2V
2 series · 2 of 2 positions shown · non-contrast
Comparison: None.

CLINICAL DATA: Pre-admit for left knee surgery

EXAM:
CHEST  2 VIEW

[w chest pa]
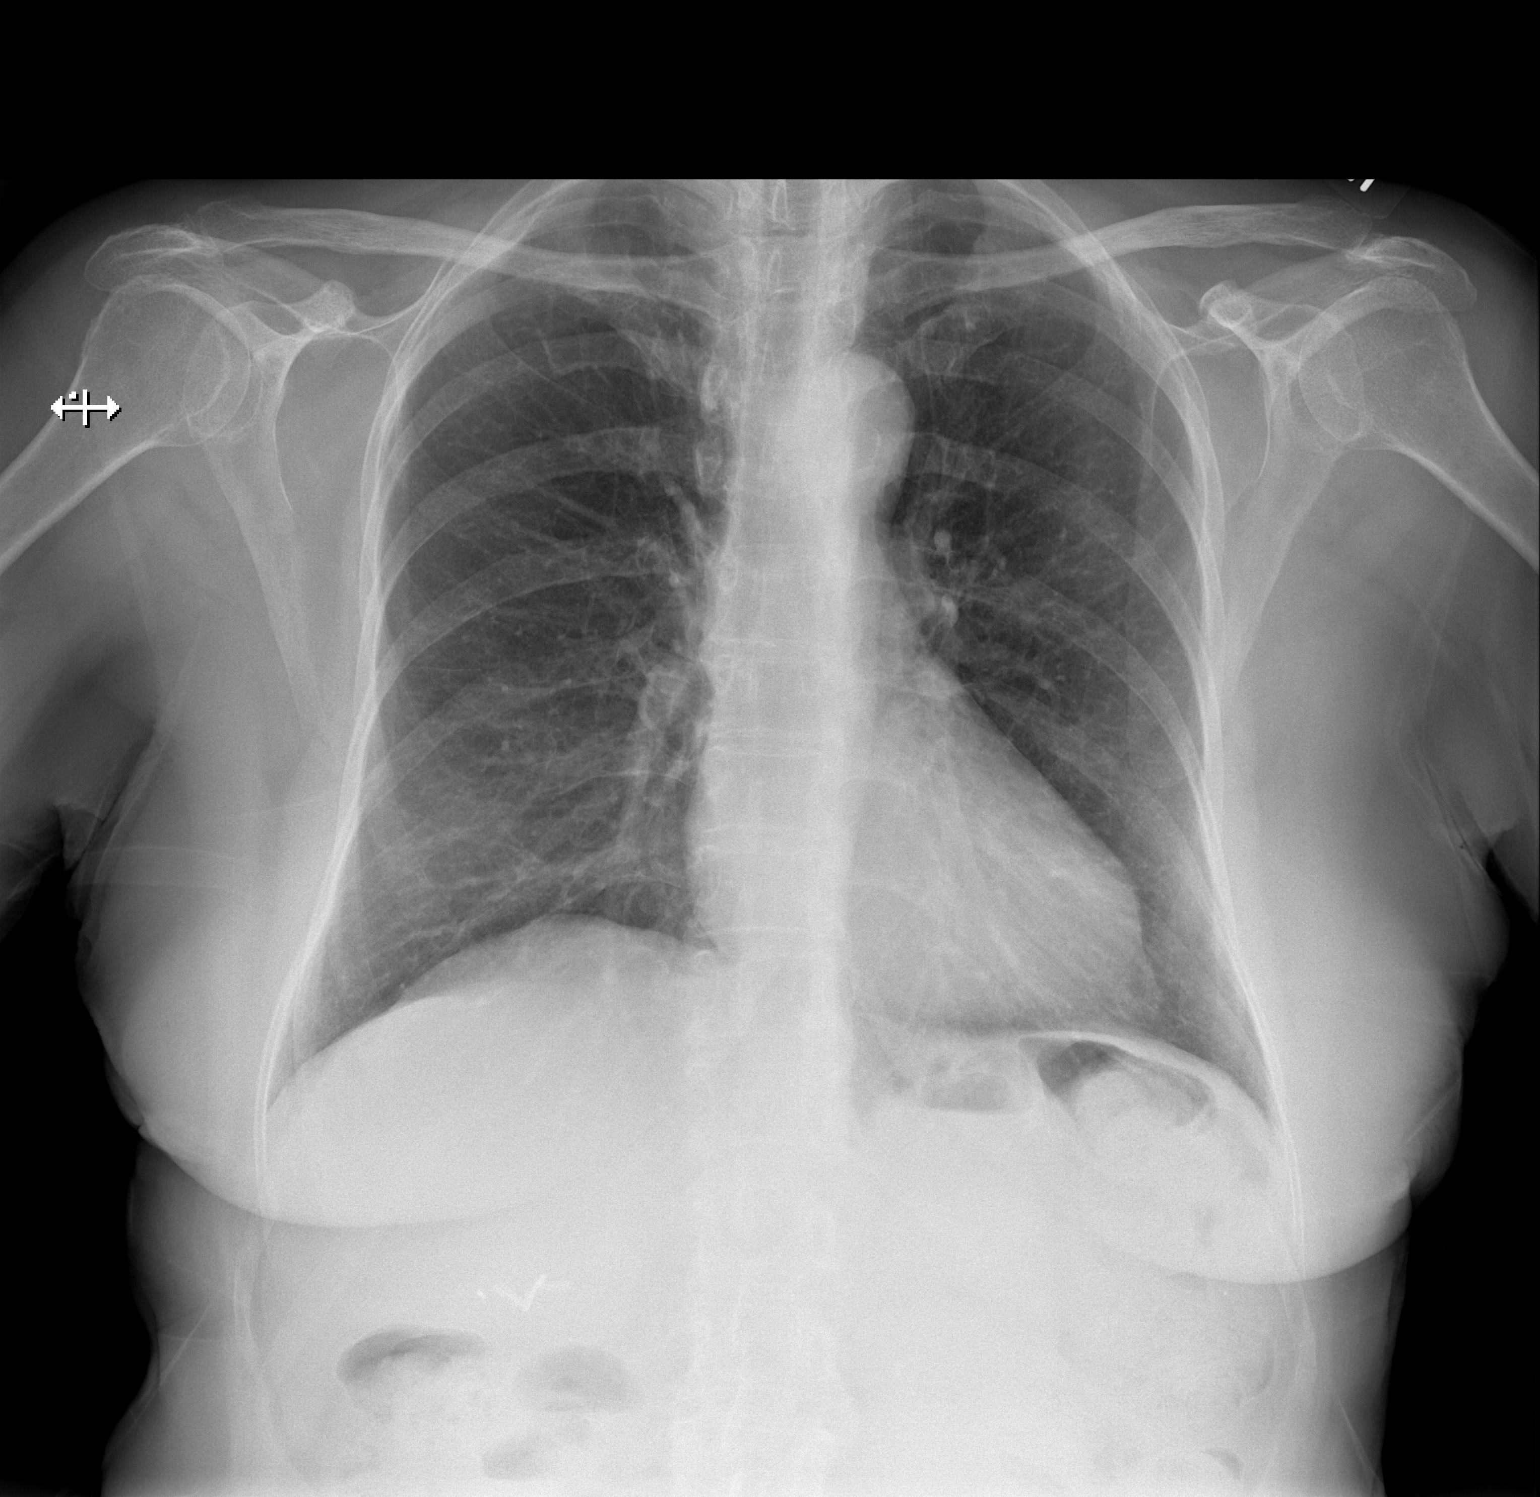

[w chest lat]
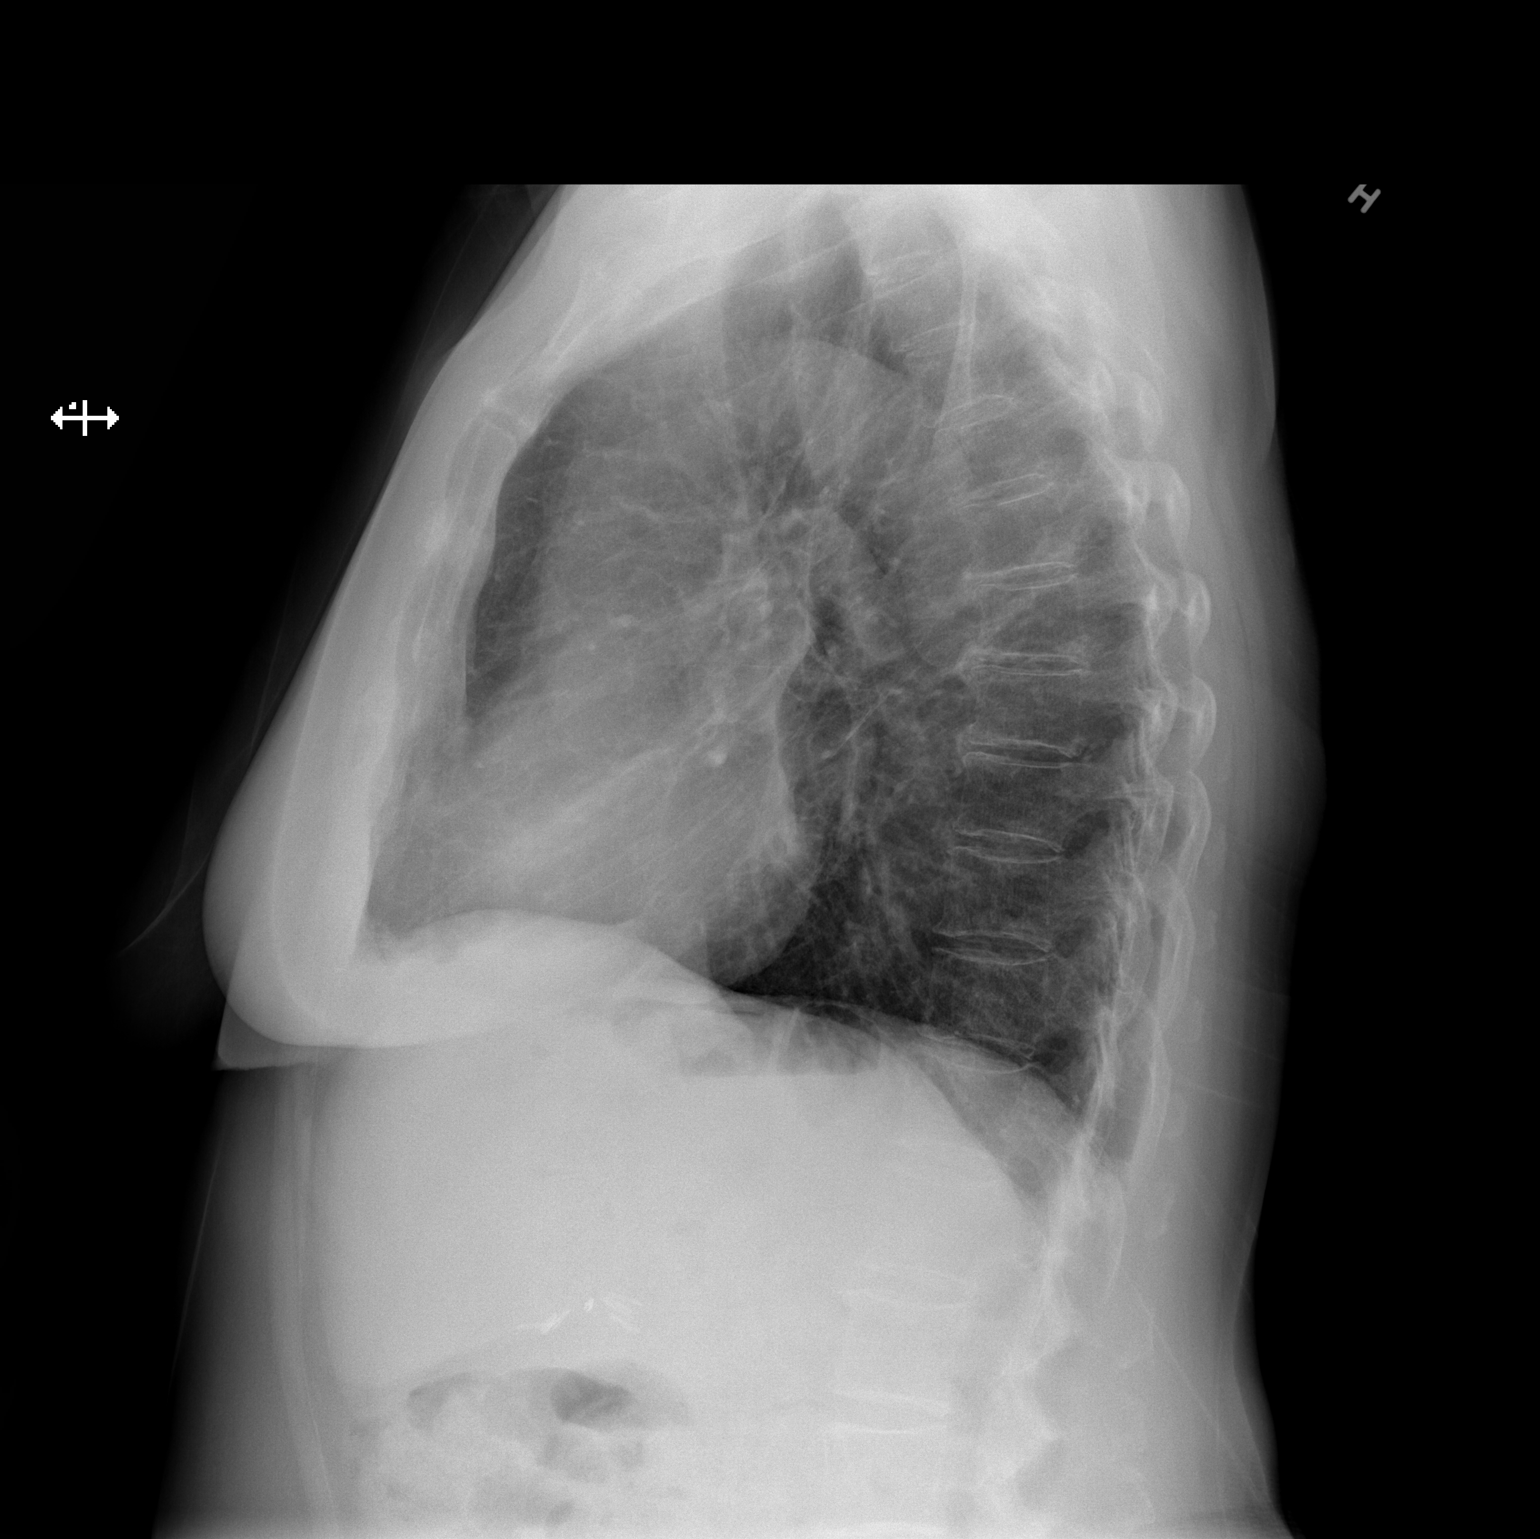

[2 of 2 positions shown; findings below may reference images not displayed]

FINDINGS: No active infiltrate or effusion is seen. Mediastinal and hilar
contours are unremarkable. The heart is mildly enlarged. No bony
abnormality is seen.
IMPRESSION: No active cardiopulmonary disease.

## 2016-01-15 IMAGING — CR DG KNEE 1-2V*L*
2 series · 2 of 2 positions shown · non-contrast
Comparison: None.

CLINICAL DATA: Status post left knee replacement

EXAM:
LEFT KNEE - 1-2 VIEW

[AP]
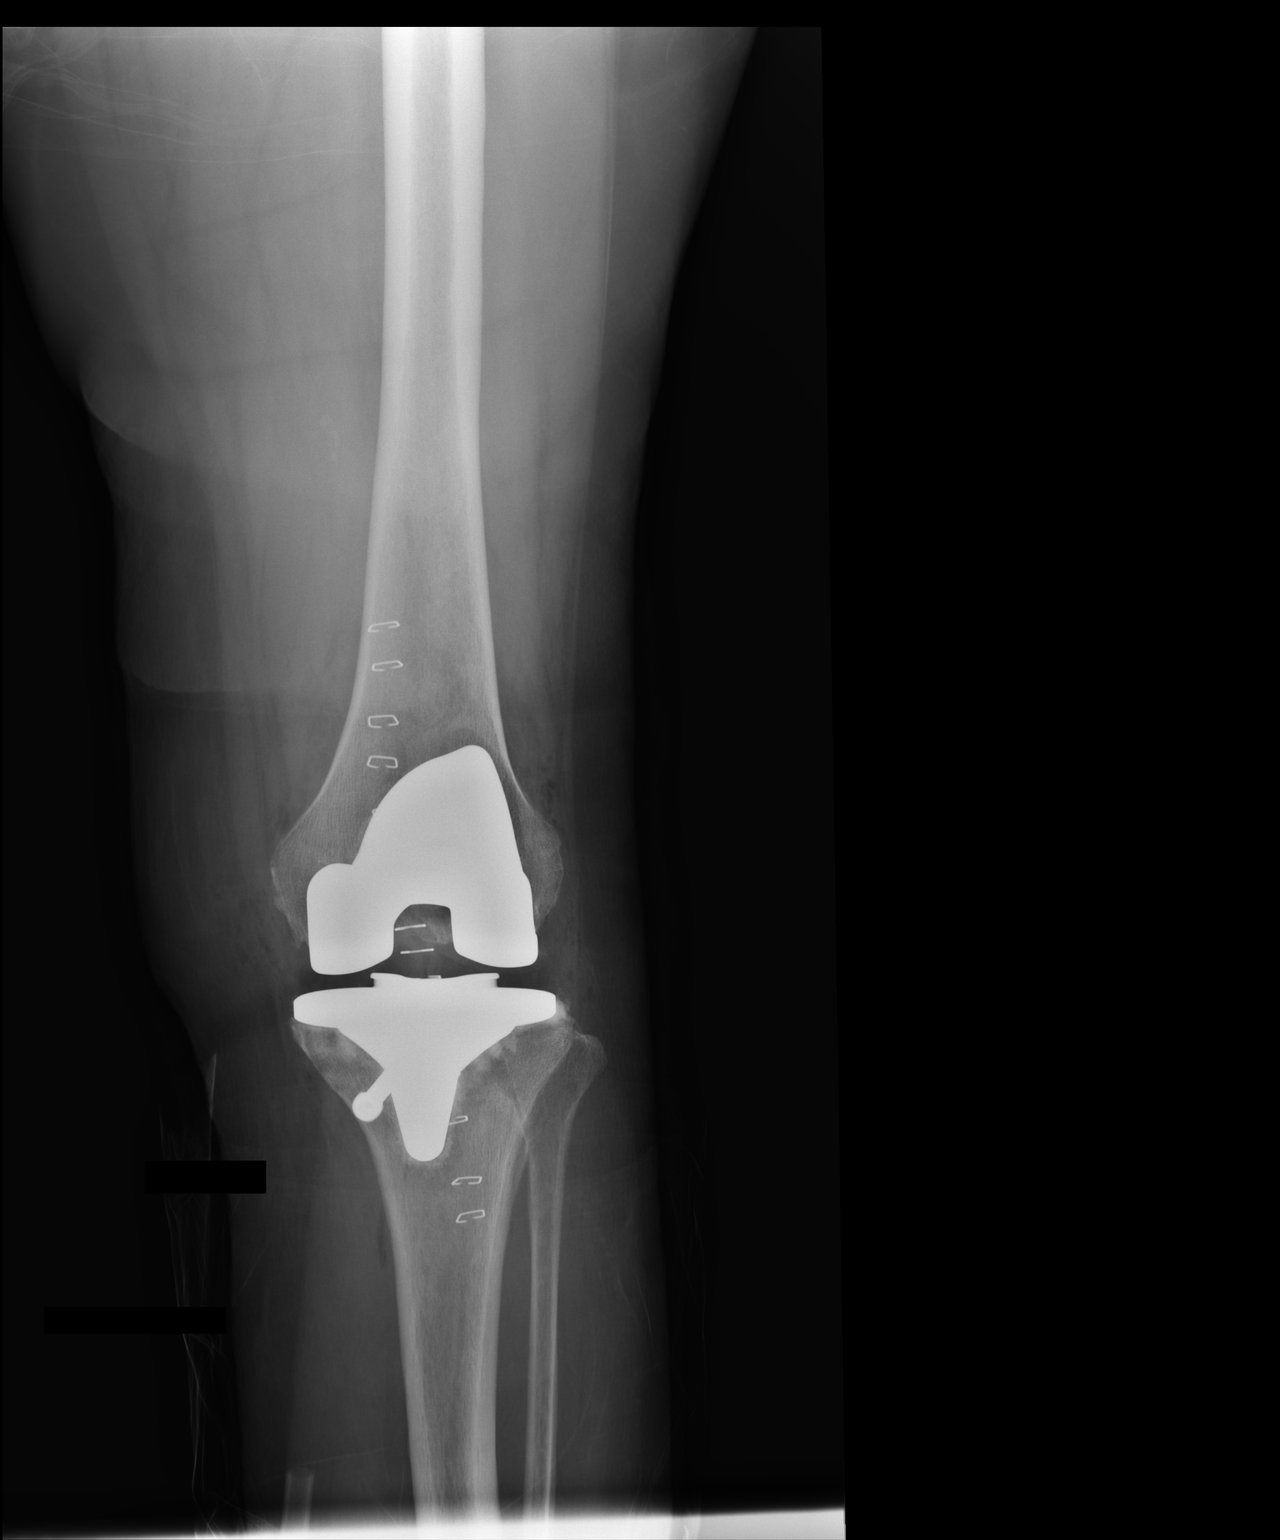

[xtable lateral]
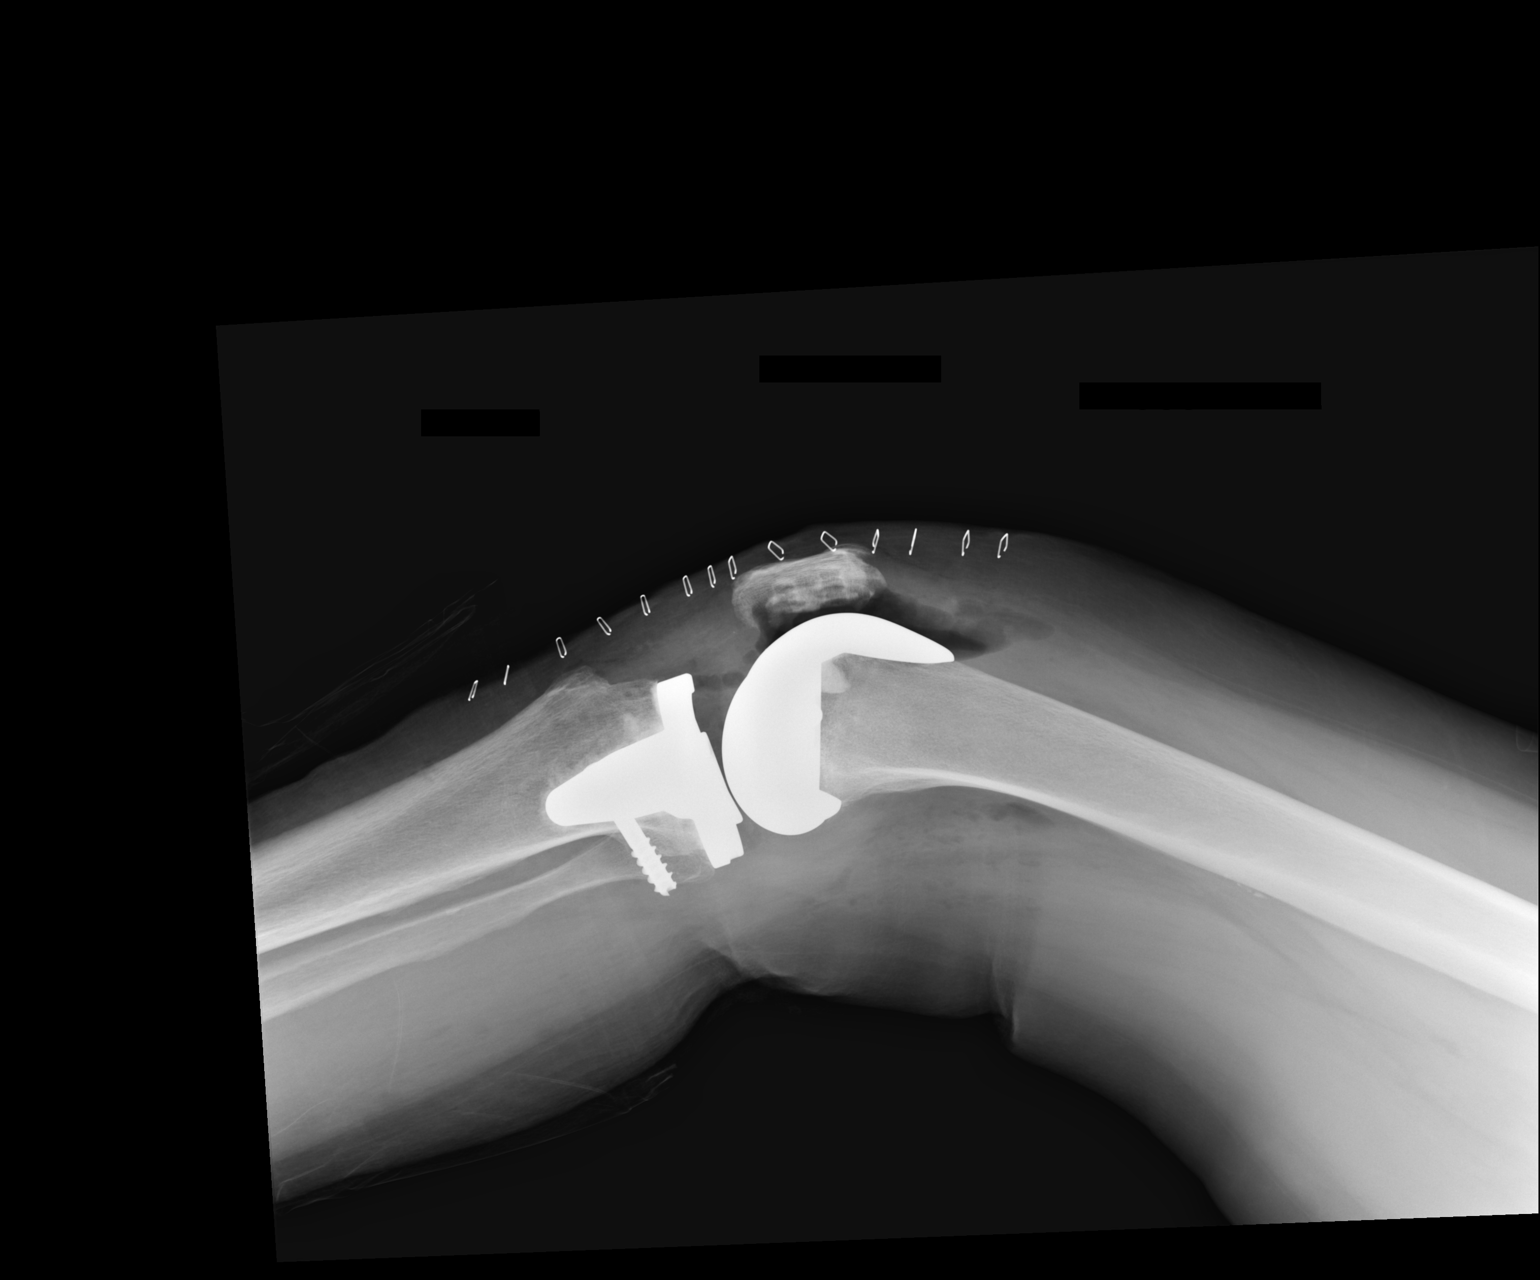

[2 of 2 positions shown; findings below may reference images not displayed]

FINDINGS: Left knee prosthesis is noted. Air is seen within the surgical bed.
No acute abnormality is noted.
IMPRESSION: Status post left knee

## 2016-01-20 DIAGNOSIS — H26491 Other secondary cataract, right eye: Secondary | ICD-10-CM | POA: Diagnosis not present

## 2016-01-20 DIAGNOSIS — H2512 Age-related nuclear cataract, left eye: Secondary | ICD-10-CM | POA: Diagnosis not present

## 2016-02-12 DIAGNOSIS — M810 Age-related osteoporosis without current pathological fracture: Secondary | ICD-10-CM | POA: Diagnosis not present

## 2016-02-12 DIAGNOSIS — E559 Vitamin D deficiency, unspecified: Secondary | ICD-10-CM | POA: Diagnosis not present

## 2016-02-12 DIAGNOSIS — I129 Hypertensive chronic kidney disease with stage 1 through stage 4 chronic kidney disease, or unspecified chronic kidney disease: Secondary | ICD-10-CM | POA: Diagnosis not present

## 2016-02-12 DIAGNOSIS — E1122 Type 2 diabetes mellitus with diabetic chronic kidney disease: Secondary | ICD-10-CM | POA: Diagnosis not present

## 2016-02-19 DIAGNOSIS — K219 Gastro-esophageal reflux disease without esophagitis: Secondary | ICD-10-CM | POA: Diagnosis not present

## 2016-02-19 DIAGNOSIS — I10 Essential (primary) hypertension: Secondary | ICD-10-CM | POA: Diagnosis not present

## 2016-02-19 DIAGNOSIS — E039 Hypothyroidism, unspecified: Secondary | ICD-10-CM | POA: Diagnosis not present

## 2016-02-19 DIAGNOSIS — E7800 Pure hypercholesterolemia, unspecified: Secondary | ICD-10-CM | POA: Diagnosis not present

## 2016-02-20 DIAGNOSIS — H26491 Other secondary cataract, right eye: Secondary | ICD-10-CM | POA: Diagnosis not present

## 2016-07-08 DIAGNOSIS — Z1231 Encounter for screening mammogram for malignant neoplasm of breast: Secondary | ICD-10-CM | POA: Diagnosis not present

## 2016-08-18 DIAGNOSIS — Z0000 Encounter for general adult medical examination without abnormal findings: Secondary | ICD-10-CM | POA: Diagnosis not present

## 2016-08-18 DIAGNOSIS — E559 Vitamin D deficiency, unspecified: Secondary | ICD-10-CM | POA: Diagnosis not present

## 2016-08-18 DIAGNOSIS — I129 Hypertensive chronic kidney disease with stage 1 through stage 4 chronic kidney disease, or unspecified chronic kidney disease: Secondary | ICD-10-CM | POA: Diagnosis not present

## 2016-08-18 DIAGNOSIS — E1122 Type 2 diabetes mellitus with diabetic chronic kidney disease: Secondary | ICD-10-CM | POA: Diagnosis not present

## 2016-08-25 DIAGNOSIS — Z0000 Encounter for general adult medical examination without abnormal findings: Secondary | ICD-10-CM | POA: Diagnosis not present

## 2016-08-25 DIAGNOSIS — E1122 Type 2 diabetes mellitus with diabetic chronic kidney disease: Secondary | ICD-10-CM | POA: Diagnosis not present

## 2016-08-25 DIAGNOSIS — N182 Chronic kidney disease, stage 2 (mild): Secondary | ICD-10-CM | POA: Diagnosis not present

## 2016-08-25 DIAGNOSIS — M810 Age-related osteoporosis without current pathological fracture: Secondary | ICD-10-CM | POA: Diagnosis not present

## 2016-11-30 DIAGNOSIS — D225 Melanocytic nevi of trunk: Secondary | ICD-10-CM | POA: Diagnosis not present

## 2016-11-30 DIAGNOSIS — Z85820 Personal history of malignant melanoma of skin: Secondary | ICD-10-CM | POA: Diagnosis not present

## 2016-11-30 DIAGNOSIS — D485 Neoplasm of uncertain behavior of skin: Secondary | ICD-10-CM | POA: Diagnosis not present

## 2016-11-30 DIAGNOSIS — L821 Other seborrheic keratosis: Secondary | ICD-10-CM | POA: Diagnosis not present

## 2016-11-30 DIAGNOSIS — L814 Other melanin hyperpigmentation: Secondary | ICD-10-CM | POA: Diagnosis not present

## 2016-11-30 DIAGNOSIS — D2271 Melanocytic nevi of right lower limb, including hip: Secondary | ICD-10-CM | POA: Diagnosis not present

## 2016-11-30 DIAGNOSIS — Z85828 Personal history of other malignant neoplasm of skin: Secondary | ICD-10-CM | POA: Diagnosis not present

## 2016-12-22 DIAGNOSIS — Z23 Encounter for immunization: Secondary | ICD-10-CM | POA: Diagnosis not present

## 2017-02-18 DIAGNOSIS — I129 Hypertensive chronic kidney disease with stage 1 through stage 4 chronic kidney disease, or unspecified chronic kidney disease: Secondary | ICD-10-CM | POA: Diagnosis not present

## 2017-02-18 DIAGNOSIS — E1122 Type 2 diabetes mellitus with diabetic chronic kidney disease: Secondary | ICD-10-CM | POA: Diagnosis not present

## 2017-02-18 DIAGNOSIS — E785 Hyperlipidemia, unspecified: Secondary | ICD-10-CM | POA: Diagnosis not present

## 2017-02-18 DIAGNOSIS — E039 Hypothyroidism, unspecified: Secondary | ICD-10-CM | POA: Diagnosis not present

## 2017-02-25 DIAGNOSIS — N390 Urinary tract infection, site not specified: Secondary | ICD-10-CM | POA: Diagnosis not present

## 2017-02-25 DIAGNOSIS — E039 Hypothyroidism, unspecified: Secondary | ICD-10-CM | POA: Diagnosis not present

## 2017-02-25 DIAGNOSIS — E1122 Type 2 diabetes mellitus with diabetic chronic kidney disease: Secondary | ICD-10-CM | POA: Diagnosis not present

## 2017-02-25 DIAGNOSIS — I129 Hypertensive chronic kidney disease with stage 1 through stage 4 chronic kidney disease, or unspecified chronic kidney disease: Secondary | ICD-10-CM | POA: Diagnosis not present

## 2017-02-25 DIAGNOSIS — M810 Age-related osteoporosis without current pathological fracture: Secondary | ICD-10-CM | POA: Diagnosis not present

## 2017-02-25 DIAGNOSIS — E785 Hyperlipidemia, unspecified: Secondary | ICD-10-CM | POA: Diagnosis not present

## 2017-04-13 DIAGNOSIS — H2513 Age-related nuclear cataract, bilateral: Secondary | ICD-10-CM | POA: Diagnosis not present

## 2017-04-22 DIAGNOSIS — H2512 Age-related nuclear cataract, left eye: Secondary | ICD-10-CM | POA: Diagnosis not present

## 2017-04-22 DIAGNOSIS — H25812 Combined forms of age-related cataract, left eye: Secondary | ICD-10-CM | POA: Diagnosis not present

## 2017-05-21 DIAGNOSIS — Z961 Presence of intraocular lens: Secondary | ICD-10-CM | POA: Diagnosis not present

## 2017-06-07 ENCOUNTER — Encounter (INDEPENDENT_AMBULATORY_CARE_PROVIDER_SITE_OTHER): Payer: Self-pay | Admitting: Orthopedic Surgery

## 2017-06-07 ENCOUNTER — Ambulatory Visit (INDEPENDENT_AMBULATORY_CARE_PROVIDER_SITE_OTHER): Payer: Medicare Other | Admitting: Orthopedic Surgery

## 2017-06-07 ENCOUNTER — Ambulatory Visit (INDEPENDENT_AMBULATORY_CARE_PROVIDER_SITE_OTHER): Payer: Self-pay

## 2017-06-07 VITALS — Ht 61.0 in | Wt 145.0 lb

## 2017-06-07 DIAGNOSIS — I87323 Chronic venous hypertension (idiopathic) with inflammation of bilateral lower extremity: Secondary | ICD-10-CM | POA: Insufficient documentation

## 2017-06-07 DIAGNOSIS — M79621 Pain in right upper arm: Secondary | ICD-10-CM | POA: Diagnosis not present

## 2017-06-07 DIAGNOSIS — M25562 Pain in left knee: Secondary | ICD-10-CM | POA: Diagnosis not present

## 2017-06-07 MED ORDER — METHYLPREDNISOLONE ACETATE 40 MG/ML IJ SUSP
40.0000 mg | INTRAMUSCULAR | Status: AC | PRN
Start: 2017-06-07 — End: 2017-06-07
  Administered 2017-06-07: 40 mg via INTRA_ARTICULAR

## 2017-06-07 MED ORDER — LIDOCAINE HCL 1 % IJ SOLN
5.0000 mL | INTRAMUSCULAR | Status: AC | PRN
Start: 2017-06-07 — End: 2017-06-07
  Administered 2017-06-07: 5 mL

## 2017-06-07 NOTE — Progress Notes (Signed)
Office Visit Note   Patient: Rachel Pierce           Date of Birth: 06/11/1934           MRN: [id number] Visit Date: 06/07/2017              Requested by: Thressa Sheller, [address], [place], [place] [address] PCP: Merrilee Seashore, MD  Chief Complaint  Patient presents with  . Left Knee - Follow-up    S/p TKA 08/01/14      HPI: Patient is a 83 year old woman who presents with right shoulder pain pain with lifting as well as internal rotation.  Pain radiates down into her biceps.  She states this is been present for 3 weeks with no injury.  She states she has been having of the proximal leg pain on the left not around her knee she states this is been going on for several months.  Assessment & Plan: Visit Diagnoses:  1. Acute pain of left knee   2. Pain in right upper arm   3. Idiopathic chronic venous hypertension of both lower extremities with inflammation     Plan: Recommended knee-high compression stockings for her venous stasis swelling.  Her knee is asymptomatic.  Right shoulder was injected discussed that if she has recurrent shoulder symptoms we could proceed with an additional injection or evaluate for arthroscopic debridement.  Follow-Up Instructions: Return if symptoms worsen or fail to improve.   Ortho Exam  Patient is alert, oriented, no adenopathy, well-dressed, normal affect, normal respiratory effort. Examination patient has a normal gait.  She has pitting venous stasis edema in the left leg and this is tender to palpation there is no redness no cellulitis no signs of DVT.  There are no venous ulcers.  There is no pain with range of motion of her knee.  Examination of right shoulder she has pain with Neer Hawkins impingement test pain over the biceps tendon.  AC joint is not tender to palpation thoracic outlet is nontender.  Imaging: Xr Knee 1-2 Views Left  Result Date: 06/07/2017 2 view radiographs of the left knee shows a stable  total knee arthroplasty.  There is stable internal fixation no signs of loosening no varus or valgus malalignment.  Xr Humerus Right  Result Date: 06/07/2017 2 view radiographs of the right shoulder shows a congruent glenohumeral joint there is decreased subacromial joint space with arthritis of the acromioclavicular joint.  The lung field is clear.  No images are attached to the encounter.  Labs: No results found for: HGBA1C, ESRSEDRATE, CRP, LABURIC, REPTSTATUS, GRAMSTAIN, CULT, LABORGA  @LABSALLVALUES (HGBA1)@  Body mass index is 27.4 kg/m.  Orders:  Orders Placed This Encounter  Procedures  . XR Knee 1-2 Views Left  . XR Humerus Right   No orders of the defined types were placed in this encounter.    Procedures: Large Joint Inj: R subacromial bursa on 06/07/2017 11:07 AM Indications: diagnostic evaluation and pain Details: 22 G 1.5 in needle, posterior approach  Arthrogram: No  Medications: 5 mL lidocaine 1 %; 40 mg methylPREDNISolone acetate 40 MG/ML Outcome: tolerated well, no immediate complications Procedure, treatment alternatives, risks and benefits explained, specific risks discussed. Consent was given by the patient. Immediately prior to procedure a time out was called to verify the correct patient, procedure, equipment, support staff and site/side marked as required. Patient was prepped and draped in the usual sterile fashion.      Clinical Data: No additional findings.  ROS:  All other systems negative, except as noted in the HPI. Review of Systems  Objective: Vital Signs: Ht 5\' 1"  (1.549 m)   Wt 145 lb (65.8 kg)   BMI 27.40 kg/m   Specialty Comments:  No specialty comments available.  PMFS History: Patient Active Problem List   Diagnosis Date Noted  . Idiopathic chronic venous hypertension of both lower extremities with inflammation 06/07/2017  . Total knee replacement status 08/01/2014   Past Medical History:  Diagnosis Date  . Arthritis     . Cancer ([place])    40 YRS AGO MALIGNANT MELANOMA LEG+ LT EAR  . Cerebral aneurysm    X2   "PROBABLY WAS BORN WITH THEM, THEY ARE TINY"  . Cystitis   . GERD (gastroesophageal reflux disease)   . Hypertension   . Hypothyroidism     No family history on file.  Past Surgical History:  Procedure Laterality Date  . CHOLECYSTECTOMY    . SKIN MELANOMAS REMOVED    . TOTAL KNEE ARTHROPLASTY Left 08/01/2014   Procedure: TOTAL KNEE ARTHROPLASTY;  Surgeon: Newt Minion, MD;  Location: [facility];  Service: Orthopedics;  Laterality: Left;  . WISDOM TOOTH EXTRACTION     1 WEEK AGO REMOVED   Social History   Occupational History  . Not on file  Tobacco Use  . Smoking status: Former Research scientist (life sciences)  . Smokeless tobacco: Never Used  Substance and Sexual Activity  . Alcohol use: Yes    Comment: OCCAS  . Drug use: No  . Sexual activity: Not on file

## 2017-08-24 DIAGNOSIS — N390 Urinary tract infection, site not specified: Secondary | ICD-10-CM | POA: Diagnosis not present

## 2017-08-24 DIAGNOSIS — Z0000 Encounter for general adult medical examination without abnormal findings: Secondary | ICD-10-CM | POA: Diagnosis not present

## 2017-08-24 DIAGNOSIS — E1122 Type 2 diabetes mellitus with diabetic chronic kidney disease: Secondary | ICD-10-CM | POA: Diagnosis not present

## 2017-08-24 DIAGNOSIS — M810 Age-related osteoporosis without current pathological fracture: Secondary | ICD-10-CM | POA: Diagnosis not present

## 2017-08-31 DIAGNOSIS — E1122 Type 2 diabetes mellitus with diabetic chronic kidney disease: Secondary | ICD-10-CM | POA: Diagnosis not present

## 2017-08-31 DIAGNOSIS — I129 Hypertensive chronic kidney disease with stage 1 through stage 4 chronic kidney disease, or unspecified chronic kidney disease: Secondary | ICD-10-CM | POA: Diagnosis not present

## 2017-08-31 DIAGNOSIS — E785 Hyperlipidemia, unspecified: Secondary | ICD-10-CM | POA: Diagnosis not present

## 2017-08-31 DIAGNOSIS — E1121 Type 2 diabetes mellitus with diabetic nephropathy: Secondary | ICD-10-CM | POA: Diagnosis not present

## 2017-08-31 DIAGNOSIS — Z23 Encounter for immunization: Secondary | ICD-10-CM | POA: Diagnosis not present

## 2017-08-31 DIAGNOSIS — N182 Chronic kidney disease, stage 2 (mild): Secondary | ICD-10-CM | POA: Diagnosis not present

## 2017-11-30 DIAGNOSIS — L570 Actinic keratosis: Secondary | ICD-10-CM | POA: Diagnosis not present

## 2017-11-30 DIAGNOSIS — Z85828 Personal history of other malignant neoplasm of skin: Secondary | ICD-10-CM | POA: Diagnosis not present

## 2017-11-30 DIAGNOSIS — L820 Inflamed seborrheic keratosis: Secondary | ICD-10-CM | POA: Diagnosis not present

## 2017-11-30 DIAGNOSIS — L821 Other seborrheic keratosis: Secondary | ICD-10-CM | POA: Diagnosis not present

## 2017-11-30 DIAGNOSIS — Z85820 Personal history of malignant melanoma of skin: Secondary | ICD-10-CM | POA: Diagnosis not present

## 2017-11-30 DIAGNOSIS — L900 Lichen sclerosus et atrophicus: Secondary | ICD-10-CM | POA: Diagnosis not present

## 2017-11-30 DIAGNOSIS — D225 Melanocytic nevi of trunk: Secondary | ICD-10-CM | POA: Diagnosis not present

## 2017-12-22 DIAGNOSIS — Z23 Encounter for immunization: Secondary | ICD-10-CM | POA: Diagnosis not present

## 2017-12-23 ENCOUNTER — Ambulatory Visit (INDEPENDENT_AMBULATORY_CARE_PROVIDER_SITE_OTHER): Payer: Medicare Other | Admitting: Orthopedic Surgery

## 2017-12-23 ENCOUNTER — Ambulatory Visit (INDEPENDENT_AMBULATORY_CARE_PROVIDER_SITE_OTHER): Payer: Self-pay

## 2017-12-23 ENCOUNTER — Encounter (INDEPENDENT_AMBULATORY_CARE_PROVIDER_SITE_OTHER): Payer: Self-pay | Admitting: Orthopedic Surgery

## 2017-12-23 VITALS — Ht 61.0 in | Wt 145.0 lb

## 2017-12-23 DIAGNOSIS — S329XXA Fracture of unspecified parts of lumbosacral spine and pelvis, initial encounter for closed fracture: Secondary | ICD-10-CM

## 2017-12-23 DIAGNOSIS — M25551 Pain in right hip: Secondary | ICD-10-CM

## 2017-12-23 NOTE — Progress Notes (Signed)
Office Visit Note   Patient: Rachel Pierce           Date of Birth: 1935-02-06           MRN: [id number] Visit Date: 12/23/2017              Requested by: Merrilee Seashore, [address] [place], [place] [address] PCP: Merrilee Seashore, MD  Chief Complaint  Patient presents with  . Right Hip - Pain      HPI: Patient is an 83 year old woman who states that 4 weeks ago she fell on both knees she states she has been having right-sided groin pain worse getting up and down from the chair but states it is feeling better.  Assessment & Plan: Visit Diagnoses:  1. Pain of right hip joint   2. Pelvis fracture, right, closed, initial encounter [name])     Plan: Discussed that she has a nondisplaced pubic rami fracture.  She will increase her activities as tolerated discussed that she can use Tylenol or over-the-counter anti-inflammatory.  If there is any acute increase in symptoms she will follow-up we would need to get a CT scan to evaluate for other pathology.  Follow-Up Instructions: No follow-ups on file.   Ortho Exam  Patient is alert, oriented, no adenopathy, well-dressed, normal affect, normal respiratory effort. Examination patient has difficulty getting up from a sitting to a standing position she complains of groin pain.  She has a negative straight leg raise no focal motor weakness she has no pain with internal or external rotation of her hip no pain with range of motion of the knee.  Imaging: No results found. No images are attached to the encounter.  Labs: No results found for: HGBA1C, ESRSEDRATE, CRP, LABURIC, REPTSTATUS, GRAMSTAIN, CULT, LABORGA   Lab Results  Component Value Date   ALBUMIN 4.3 07/26/2014    Body mass index is 27.4 kg/m.  Orders:  Orders Placed This Encounter  Procedures  . XR HIP UNILAT W OR W/O PELVIS 2-3 VIEWS RIGHT   No orders of the defined types were placed in this encounter.    Procedures: No procedures  performed  Clinical Data: No additional findings.  ROS:  All other systems negative, except as noted in the HPI. Review of Systems  Objective: Vital Signs: Ht 5\' 1"  (1.549 m)   Wt 145 lb (65.8 kg)   BMI 27.40 kg/m   Specialty Comments:  No specialty comments available.  PMFS History: Patient Active Problem List   Diagnosis Date Noted  . Idiopathic chronic venous hypertension of both lower extremities with inflammation 06/07/2017  . Total knee replacement status 08/01/2014   Past Medical History:  Diagnosis Date  . Arthritis   . Cancer ([place])    40 YRS AGO MALIGNANT MELANOMA LEG+ LT EAR  . Cerebral aneurysm    X2   "PROBABLY WAS BORN WITH THEM, THEY ARE TINY"  . Cystitis   . GERD (gastroesophageal reflux disease)   . Hypertension   . Hypothyroidism     History reviewed. No pertinent family history.  Past Surgical History:  Procedure Laterality Date  . CHOLECYSTECTOMY    . SKIN MELANOMAS REMOVED    . TOTAL KNEE ARTHROPLASTY Left 08/01/2014   Procedure: TOTAL KNEE ARTHROPLASTY;  Surgeon: Newt Minion, MD;  Location: [facility];  Service: Orthopedics;  Laterality: Left;  . WISDOM TOOTH EXTRACTION     1 WEEK AGO REMOVED   Social History   Occupational History  .  Not on file  Tobacco Use  . Smoking status: Former Research scientist (life sciences)  . Smokeless tobacco: Never Used  Substance and Sexual Activity  . Alcohol use: Yes    Comment: OCCAS  . Drug use: No  . Sexual activity: Not on file

## 2018-01-11 DIAGNOSIS — L900 Lichen sclerosus et atrophicus: Secondary | ICD-10-CM | POA: Diagnosis not present

## 2018-01-24 DIAGNOSIS — R3915 Urgency of urination: Secondary | ICD-10-CM | POA: Diagnosis not present

## 2018-01-24 DIAGNOSIS — N390 Urinary tract infection, site not specified: Secondary | ICD-10-CM | POA: Diagnosis not present

## 2018-02-22 DIAGNOSIS — I129 Hypertensive chronic kidney disease with stage 1 through stage 4 chronic kidney disease, or unspecified chronic kidney disease: Secondary | ICD-10-CM | POA: Diagnosis not present

## 2018-02-22 DIAGNOSIS — E1121 Type 2 diabetes mellitus with diabetic nephropathy: Secondary | ICD-10-CM | POA: Diagnosis not present

## 2018-02-22 DIAGNOSIS — N182 Chronic kidney disease, stage 2 (mild): Secondary | ICD-10-CM | POA: Diagnosis not present

## 2018-02-22 DIAGNOSIS — N390 Urinary tract infection, site not specified: Secondary | ICD-10-CM | POA: Diagnosis not present

## 2018-02-22 DIAGNOSIS — E1122 Type 2 diabetes mellitus with diabetic chronic kidney disease: Secondary | ICD-10-CM | POA: Diagnosis not present

## 2018-03-01 DIAGNOSIS — N182 Chronic kidney disease, stage 2 (mild): Secondary | ICD-10-CM | POA: Diagnosis not present

## 2018-03-01 DIAGNOSIS — E1121 Type 2 diabetes mellitus with diabetic nephropathy: Secondary | ICD-10-CM | POA: Diagnosis not present

## 2018-03-01 DIAGNOSIS — I129 Hypertensive chronic kidney disease with stage 1 through stage 4 chronic kidney disease, or unspecified chronic kidney disease: Secondary | ICD-10-CM | POA: Diagnosis not present

## 2018-03-01 DIAGNOSIS — E1122 Type 2 diabetes mellitus with diabetic chronic kidney disease: Secondary | ICD-10-CM | POA: Diagnosis not present

## 2018-05-23 DIAGNOSIS — E1122 Type 2 diabetes mellitus with diabetic chronic kidney disease: Secondary | ICD-10-CM | POA: Diagnosis not present

## 2018-05-23 DIAGNOSIS — L0390 Cellulitis, unspecified: Secondary | ICD-10-CM | POA: Diagnosis not present

## 2018-05-24 DIAGNOSIS — H52203 Unspecified astigmatism, bilateral: Secondary | ICD-10-CM | POA: Diagnosis not present

## 2018-05-24 DIAGNOSIS — Z961 Presence of intraocular lens: Secondary | ICD-10-CM | POA: Diagnosis not present

## 2018-05-30 DIAGNOSIS — E1122 Type 2 diabetes mellitus with diabetic chronic kidney disease: Secondary | ICD-10-CM | POA: Diagnosis not present

## 2018-05-30 DIAGNOSIS — L0390 Cellulitis, unspecified: Secondary | ICD-10-CM | POA: Diagnosis not present

## 2018-09-05 DIAGNOSIS — D485 Neoplasm of uncertain behavior of skin: Secondary | ICD-10-CM | POA: Diagnosis not present

## 2018-09-05 DIAGNOSIS — L570 Actinic keratosis: Secondary | ICD-10-CM | POA: Diagnosis not present

## 2018-09-07 DIAGNOSIS — N182 Chronic kidney disease, stage 2 (mild): Secondary | ICD-10-CM | POA: Diagnosis not present

## 2018-09-07 DIAGNOSIS — I129 Hypertensive chronic kidney disease with stage 1 through stage 4 chronic kidney disease, or unspecified chronic kidney disease: Secondary | ICD-10-CM | POA: Diagnosis not present

## 2018-09-07 DIAGNOSIS — E1121 Type 2 diabetes mellitus with diabetic nephropathy: Secondary | ICD-10-CM | POA: Diagnosis not present

## 2018-09-07 DIAGNOSIS — Z7189 Other specified counseling: Secondary | ICD-10-CM | POA: Diagnosis not present

## 2018-09-07 DIAGNOSIS — E1122 Type 2 diabetes mellitus with diabetic chronic kidney disease: Secondary | ICD-10-CM | POA: Diagnosis not present

## 2018-09-07 DIAGNOSIS — Z0000 Encounter for general adult medical examination without abnormal findings: Secondary | ICD-10-CM | POA: Diagnosis not present

## 2018-09-22 DIAGNOSIS — N182 Chronic kidney disease, stage 2 (mild): Secondary | ICD-10-CM | POA: Diagnosis not present

## 2018-09-22 DIAGNOSIS — E1122 Type 2 diabetes mellitus with diabetic chronic kidney disease: Secondary | ICD-10-CM | POA: Diagnosis not present

## 2018-09-22 DIAGNOSIS — M810 Age-related osteoporosis without current pathological fracture: Secondary | ICD-10-CM | POA: Diagnosis not present

## 2018-09-22 DIAGNOSIS — E1169 Type 2 diabetes mellitus with other specified complication: Secondary | ICD-10-CM | POA: Diagnosis not present

## 2018-09-22 DIAGNOSIS — Z0000 Encounter for general adult medical examination without abnormal findings: Secondary | ICD-10-CM | POA: Diagnosis not present

## 2019-02-20 DIAGNOSIS — E1122 Type 2 diabetes mellitus with diabetic chronic kidney disease: Secondary | ICD-10-CM | POA: Diagnosis not present

## 2019-02-20 DIAGNOSIS — I129 Hypertensive chronic kidney disease with stage 1 through stage 4 chronic kidney disease, or unspecified chronic kidney disease: Secondary | ICD-10-CM | POA: Diagnosis not present

## 2019-02-20 DIAGNOSIS — E039 Hypothyroidism, unspecified: Secondary | ICD-10-CM | POA: Diagnosis not present

## 2019-02-20 DIAGNOSIS — E785 Hyperlipidemia, unspecified: Secondary | ICD-10-CM | POA: Diagnosis not present

## 2019-02-27 DIAGNOSIS — E1169 Type 2 diabetes mellitus with other specified complication: Secondary | ICD-10-CM | POA: Diagnosis not present

## 2019-02-27 DIAGNOSIS — E1122 Type 2 diabetes mellitus with diabetic chronic kidney disease: Secondary | ICD-10-CM | POA: Diagnosis not present

## 2019-02-27 DIAGNOSIS — I129 Hypertensive chronic kidney disease with stage 1 through stage 4 chronic kidney disease, or unspecified chronic kidney disease: Secondary | ICD-10-CM | POA: Diagnosis not present

## 2019-05-04 ENCOUNTER — Ambulatory Visit: Payer: Medicare Other | Attending: Internal Medicine

## 2019-06-27 DIAGNOSIS — Z961 Presence of intraocular lens: Secondary | ICD-10-CM | POA: Diagnosis not present

## 2019-06-27 DIAGNOSIS — H52203 Unspecified astigmatism, bilateral: Secondary | ICD-10-CM | POA: Diagnosis not present

## 2019-09-11 ENCOUNTER — Ambulatory Visit: Payer: Self-pay

## 2019-09-11 ENCOUNTER — Other Ambulatory Visit: Payer: Self-pay

## 2019-09-11 ENCOUNTER — Encounter: Payer: Self-pay | Admitting: Orthopedic Surgery

## 2019-09-11 ENCOUNTER — Ambulatory Visit (INDEPENDENT_AMBULATORY_CARE_PROVIDER_SITE_OTHER): Payer: Medicare Other | Admitting: Orthopedic Surgery

## 2019-09-11 DIAGNOSIS — M25561 Pain in right knee: Secondary | ICD-10-CM

## 2019-09-11 DIAGNOSIS — M25511 Pain in right shoulder: Secondary | ICD-10-CM

## 2019-09-11 DIAGNOSIS — M7541 Impingement syndrome of right shoulder: Secondary | ICD-10-CM

## 2019-09-11 MED ORDER — METHYLPREDNISOLONE ACETATE 40 MG/ML IJ SUSP
40.0000 mg | INTRAMUSCULAR | Status: AC | PRN
  Administered 2019-09-11: 40 mg via INTRA_ARTICULAR

## 2019-09-11 MED ORDER — LIDOCAINE HCL (PF) 1 % IJ SOLN
5.0000 mL | INTRAMUSCULAR | Status: AC | PRN
  Administered 2019-09-11: 5 mL

## 2019-09-11 MED ORDER — LIDOCAINE HCL 1 % IJ SOLN
5.0000 mL | INTRAMUSCULAR | Status: AC | PRN
  Administered 2019-09-11: 5 mL

## 2019-09-11 NOTE — Progress Notes (Signed)
Office Visit Note   Patient: Rachel Pierce           Date of Birth: 08/27/1934           MRN: [id number] Visit Date: 09/11/2019              Requested by: Merrilee Seashore, [address] [place],   [id number] PCP: Merrilee Seashore, MD  Chief Complaint  Patient presents with  . Right Shoulder - Pain  . Right Knee - Pain      HPI: Patient is an 85 year old woman who presents complaining of chronic right shoulder and right knee pain.  She states the shoulder is more painful when she tries to reach overhead or behind herself.  Patient states the knee is painful with activities of daily living.  She denies any giving way or locking.  Assessment & Plan: Visit Diagnoses:  1. Right shoulder pain, unspecified chronicity   2. Right knee pain, unspecified chronicity   3. Impingement syndrome of right shoulder     Plan: Patient tolerated the injections well she will follow-up if she develops further symptoms.  Follow-Up Instructions: Return if symptoms worsen or fail to improve.   Ortho Exam  Patient is alert, oriented, no adenopathy, well-dressed, normal affect, normal respiratory effort. Examination patient has decreased range of motion of her right shoulder abduction flexion to 70 degrees.  She has pain with Neer and Hawkins impingement test she has pain to palpation of the biceps tendon.  Examination of the right knee there is no redness no cellulitis there is mild effusion collaterals and cruciates are stable she has crepitation with range of motion medial lateral joint lines are minimally tender to palpation.  Imaging: XR KNEE 3 VIEW RIGHT  Result Date: 09/11/2019 2 view radiographs of the right knee shows calcification of the medial and lateral meniscus with subcondylar sclerosis.  The left knee shows a stable total knee arthroplasty.  XR Shoulder Right  Result Date: 09/11/2019 2 view radiographs of the right shoulder shows arthritic changes  of the [facility] joint as well as superior migration of the humeral head.  No images are attached to the encounter.  Labs: No results found for: HGBA1C, ESRSEDRATE, CRP, LABURIC, REPTSTATUS, GRAMSTAIN, CULT, LABORGA   Lab Results  Component Value Date   ALBUMIN 4.3 07/26/2014    No results found for: MG No results found for: VD25OH  No results found for: PREALBUMIN CBC EXTENDED Latest Ref Rng & Units 07/26/2014  WBC 4.0 - 10.5 K/uL 8.3  RBC 3.87 - 5.11 MIL/uL 4.72  HGB 12.0 - 15.0 g/dL 14.4  HCT 36 - 46 % 43.0  PLT 150 - 400 K/uL 222     There is no height or weight on file to calculate BMI.  Orders:  Orders Placed This Encounter  Procedures  . XR Shoulder Right  . XR KNEE 3 VIEW RIGHT   No orders of the defined types were placed in this encounter.    Procedures: Large Joint Inj: R knee on 09/11/2019 4:33 PM Indications: pain and diagnostic evaluation Details: 22 G 1.5 in needle, anteromedial approach  Arthrogram: No  Medications: 5 mL lidocaine (PF) 1 %; 40 mg methylPREDNISolone acetate 40 MG/ML Outcome: tolerated well, no immediate complications Procedure, treatment alternatives, risks and benefits explained, specific risks discussed. Consent was given by the patient. Immediately prior to procedure a time out was called to verify the correct patient, procedure, equipment, support staff and site/side marked as required.  Patient was prepped and draped in the usual sterile fashion.   Large Joint Inj: R subacromial bursa on 09/11/2019 4:33 PM Indications: diagnostic evaluation and pain Details: 22 G 1.5 in needle, posterior approach  Arthrogram: No  Medications: 5 mL lidocaine 1 %; 40 mg methylPREDNISolone acetate 40 MG/ML Outcome: tolerated well, no immediate complications Procedure, treatment alternatives, risks and benefits explained, specific risks discussed. Consent was given by the patient. Immediately prior to procedure a time out was called to verify the correct  patient, procedure, equipment, support staff and site/side marked as required. Patient was prepped and draped in the usual sterile fashion.      Clinical Data: No additional findings.  ROS:  All other systems negative, except as noted in the HPI. Review of Systems  Objective: Vital Signs: There were no vitals taken for this visit.  Specialty Comments:  No specialty comments available.  PMFS History: Patient Active Problem List   Diagnosis Date Noted  . Idiopathic chronic venous hypertension of both lower extremities with inflammation 06/07/2017  . Total knee replacement status 08/01/2014   Past Medical History:  Diagnosis Date  . Arthritis   . Cancer ([place])    40 YRS AGO MALIGNANT MELANOMA LEG+ LT EAR  . Cerebral aneurysm    X2   "PROBABLY WAS BORN WITH THEM, THEY ARE TINY"  . Cystitis   . GERD (gastroesophageal reflux disease)   . Hypertension   . Hypothyroidism     History reviewed. No pertinent family history.  Past Surgical History:  Procedure Laterality Date  . CHOLECYSTECTOMY    . SKIN MELANOMAS REMOVED    . TOTAL KNEE ARTHROPLASTY Left 08/01/2014   Procedure: TOTAL KNEE ARTHROPLASTY;  Surgeon: Newt Minion, MD;  Location: [facility];  Service: Orthopedics;  Laterality: Left;  . WISDOM TOOTH EXTRACTION     1 WEEK AGO REMOVED   Social History   Occupational History  . Not on file  Tobacco Use  . Smoking status: Former Research scientist (life sciences)  . Smokeless tobacco: Never Used  Substance and Sexual Activity  . Alcohol use: Yes    Comment: OCCAS  . Drug use: No  . Sexual activity: Not on file

## 2019-09-18 DIAGNOSIS — L820 Inflamed seborrheic keratosis: Secondary | ICD-10-CM | POA: Diagnosis not present

## 2019-09-18 DIAGNOSIS — L821 Other seborrheic keratosis: Secondary | ICD-10-CM | POA: Diagnosis not present

## 2019-10-02 DIAGNOSIS — Z0000 Encounter for general adult medical examination without abnormal findings: Secondary | ICD-10-CM | POA: Diagnosis not present

## 2019-10-02 DIAGNOSIS — E1122 Type 2 diabetes mellitus with diabetic chronic kidney disease: Secondary | ICD-10-CM | POA: Diagnosis not present

## 2019-10-02 DIAGNOSIS — I129 Hypertensive chronic kidney disease with stage 1 through stage 4 chronic kidney disease, or unspecified chronic kidney disease: Secondary | ICD-10-CM | POA: Diagnosis not present

## 2019-10-02 DIAGNOSIS — E1169 Type 2 diabetes mellitus with other specified complication: Secondary | ICD-10-CM | POA: Diagnosis not present

## 2019-10-09 DIAGNOSIS — E1121 Type 2 diabetes mellitus with diabetic nephropathy: Secondary | ICD-10-CM | POA: Diagnosis not present

## 2019-10-09 DIAGNOSIS — Z0000 Encounter for general adult medical examination without abnormal findings: Secondary | ICD-10-CM | POA: Diagnosis not present

## 2019-10-09 DIAGNOSIS — E1169 Type 2 diabetes mellitus with other specified complication: Secondary | ICD-10-CM | POA: Diagnosis not present

## 2019-10-09 DIAGNOSIS — E1122 Type 2 diabetes mellitus with diabetic chronic kidney disease: Secondary | ICD-10-CM | POA: Diagnosis not present

## 2019-10-09 DIAGNOSIS — M810 Age-related osteoporosis without current pathological fracture: Secondary | ICD-10-CM | POA: Diagnosis not present

## 2019-12-08 DIAGNOSIS — Z23 Encounter for immunization: Secondary | ICD-10-CM | POA: Diagnosis not present

## 2020-01-06 ENCOUNTER — Ambulatory Visit: Payer: Medicare Other | Attending: Internal Medicine

## 2020-01-06 DIAGNOSIS — Z23 Encounter for immunization: Secondary | ICD-10-CM

## 2020-01-06 NOTE — Progress Notes (Signed)
   Covid-19 Vaccination Clinic  Name:  Rachel Pierce    MRN: [id number] DOB: [birth year]  01/06/2020  [name] was observed post Covid-19 immunization for 15 minutes without incident. She was provided with Vaccine Information Sheet and instruction to access the V-Safe system.   [name] was instructed to call 911 with any severe reactions post vaccine: Marland Kitchen Difficulty breathing  . Swelling of face and throat  . A fast heartbeat  . A bad rash all over body  . Dizziness and weakness

## 2020-03-28 DIAGNOSIS — E1169 Type 2 diabetes mellitus with other specified complication: Secondary | ICD-10-CM | POA: Diagnosis not present

## 2020-03-28 DIAGNOSIS — N182 Chronic kidney disease, stage 2 (mild): Secondary | ICD-10-CM | POA: Diagnosis not present

## 2020-03-28 DIAGNOSIS — K219 Gastro-esophageal reflux disease without esophagitis: Secondary | ICD-10-CM | POA: Diagnosis not present

## 2020-03-28 DIAGNOSIS — E1121 Type 2 diabetes mellitus with diabetic nephropathy: Secondary | ICD-10-CM | POA: Diagnosis not present

## 2020-03-28 DIAGNOSIS — N3281 Overactive bladder: Secondary | ICD-10-CM | POA: Diagnosis not present

## 2020-03-28 DIAGNOSIS — E039 Hypothyroidism, unspecified: Secondary | ICD-10-CM | POA: Diagnosis not present

## 2020-03-28 DIAGNOSIS — E785 Hyperlipidemia, unspecified: Secondary | ICD-10-CM | POA: Diagnosis not present

## 2020-03-28 DIAGNOSIS — I129 Hypertensive chronic kidney disease with stage 1 through stage 4 chronic kidney disease, or unspecified chronic kidney disease: Secondary | ICD-10-CM | POA: Diagnosis not present

## 2020-03-28 DIAGNOSIS — E1122 Type 2 diabetes mellitus with diabetic chronic kidney disease: Secondary | ICD-10-CM | POA: Diagnosis not present

## 2020-03-28 DIAGNOSIS — M810 Age-related osteoporosis without current pathological fracture: Secondary | ICD-10-CM | POA: Diagnosis not present

## 2020-04-02 DIAGNOSIS — E1121 Type 2 diabetes mellitus with diabetic nephropathy: Secondary | ICD-10-CM | POA: Diagnosis not present

## 2020-04-02 DIAGNOSIS — N182 Chronic kidney disease, stage 2 (mild): Secondary | ICD-10-CM | POA: Diagnosis not present

## 2020-04-02 DIAGNOSIS — E785 Hyperlipidemia, unspecified: Secondary | ICD-10-CM | POA: Diagnosis not present

## 2020-04-02 DIAGNOSIS — E1122 Type 2 diabetes mellitus with diabetic chronic kidney disease: Secondary | ICD-10-CM | POA: Diagnosis not present

## 2020-07-04 DIAGNOSIS — E785 Hyperlipidemia, unspecified: Secondary | ICD-10-CM | POA: Diagnosis not present

## 2020-07-04 DIAGNOSIS — E1121 Type 2 diabetes mellitus with diabetic nephropathy: Secondary | ICD-10-CM | POA: Diagnosis not present

## 2020-07-04 DIAGNOSIS — N182 Chronic kidney disease, stage 2 (mild): Secondary | ICD-10-CM | POA: Diagnosis not present

## 2020-07-04 DIAGNOSIS — E1122 Type 2 diabetes mellitus with diabetic chronic kidney disease: Secondary | ICD-10-CM | POA: Diagnosis not present

## 2020-07-11 DIAGNOSIS — E039 Hypothyroidism, unspecified: Secondary | ICD-10-CM | POA: Diagnosis not present

## 2020-07-11 DIAGNOSIS — N182 Chronic kidney disease, stage 2 (mild): Secondary | ICD-10-CM | POA: Diagnosis not present

## 2020-07-11 DIAGNOSIS — I129 Hypertensive chronic kidney disease with stage 1 through stage 4 chronic kidney disease, or unspecified chronic kidney disease: Secondary | ICD-10-CM | POA: Diagnosis not present

## 2020-07-11 DIAGNOSIS — E785 Hyperlipidemia, unspecified: Secondary | ICD-10-CM | POA: Diagnosis not present

## 2020-07-11 DIAGNOSIS — E1122 Type 2 diabetes mellitus with diabetic chronic kidney disease: Secondary | ICD-10-CM | POA: Diagnosis not present

## 2020-08-20 DIAGNOSIS — Z961 Presence of intraocular lens: Secondary | ICD-10-CM | POA: Diagnosis not present

## 2020-08-20 DIAGNOSIS — H52203 Unspecified astigmatism, bilateral: Secondary | ICD-10-CM | POA: Diagnosis not present

## 2020-10-31 DIAGNOSIS — E039 Hypothyroidism, unspecified: Secondary | ICD-10-CM | POA: Diagnosis not present

## 2020-10-31 DIAGNOSIS — I129 Hypertensive chronic kidney disease with stage 1 through stage 4 chronic kidney disease, or unspecified chronic kidney disease: Secondary | ICD-10-CM | POA: Diagnosis not present

## 2020-10-31 DIAGNOSIS — Z0000 Encounter for general adult medical examination without abnormal findings: Secondary | ICD-10-CM | POA: Diagnosis not present

## 2020-10-31 DIAGNOSIS — E1122 Type 2 diabetes mellitus with diabetic chronic kidney disease: Secondary | ICD-10-CM | POA: Diagnosis not present

## 2020-11-07 DIAGNOSIS — E1122 Type 2 diabetes mellitus with diabetic chronic kidney disease: Secondary | ICD-10-CM | POA: Diagnosis not present

## 2020-11-07 DIAGNOSIS — N3000 Acute cystitis without hematuria: Secondary | ICD-10-CM | POA: Diagnosis not present

## 2020-11-07 DIAGNOSIS — Z0000 Encounter for general adult medical examination without abnormal findings: Secondary | ICD-10-CM | POA: Diagnosis not present

## 2020-11-07 DIAGNOSIS — N182 Chronic kidney disease, stage 2 (mild): Secondary | ICD-10-CM | POA: Diagnosis not present

## 2020-11-07 DIAGNOSIS — K219 Gastro-esophageal reflux disease without esophagitis: Secondary | ICD-10-CM | POA: Diagnosis not present

## 2020-11-20 DIAGNOSIS — R350 Frequency of micturition: Secondary | ICD-10-CM | POA: Diagnosis not present

## 2020-12-06 DIAGNOSIS — Z23 Encounter for immunization: Secondary | ICD-10-CM | POA: Diagnosis not present

## 2021-01-02 DIAGNOSIS — R350 Frequency of micturition: Secondary | ICD-10-CM | POA: Diagnosis not present

## 2021-05-20 DIAGNOSIS — K219 Gastro-esophageal reflux disease without esophagitis: Secondary | ICD-10-CM | POA: Diagnosis not present

## 2021-05-20 DIAGNOSIS — E1122 Type 2 diabetes mellitus with diabetic chronic kidney disease: Secondary | ICD-10-CM | POA: Diagnosis not present

## 2021-05-20 DIAGNOSIS — Z0000 Encounter for general adult medical examination without abnormal findings: Secondary | ICD-10-CM | POA: Diagnosis not present

## 2021-05-20 DIAGNOSIS — I129 Hypertensive chronic kidney disease with stage 1 through stage 4 chronic kidney disease, or unspecified chronic kidney disease: Secondary | ICD-10-CM | POA: Diagnosis not present

## 2021-05-20 DIAGNOSIS — N182 Chronic kidney disease, stage 2 (mild): Secondary | ICD-10-CM | POA: Diagnosis not present

## 2021-05-28 DIAGNOSIS — E785 Hyperlipidemia, unspecified: Secondary | ICD-10-CM | POA: Diagnosis not present

## 2021-05-28 DIAGNOSIS — N182 Chronic kidney disease, stage 2 (mild): Secondary | ICD-10-CM | POA: Diagnosis not present

## 2021-05-28 DIAGNOSIS — E039 Hypothyroidism, unspecified: Secondary | ICD-10-CM | POA: Diagnosis not present

## 2021-05-28 DIAGNOSIS — I129 Hypertensive chronic kidney disease with stage 1 through stage 4 chronic kidney disease, or unspecified chronic kidney disease: Secondary | ICD-10-CM | POA: Diagnosis not present

## 2021-05-28 DIAGNOSIS — E1122 Type 2 diabetes mellitus with diabetic chronic kidney disease: Secondary | ICD-10-CM | POA: Diagnosis not present

## 2021-08-27 DIAGNOSIS — H35041 Retinal micro-aneurysms, unspecified, right eye: Secondary | ICD-10-CM | POA: Diagnosis not present

## 2021-08-27 DIAGNOSIS — H3500 Unspecified background retinopathy: Secondary | ICD-10-CM | POA: Diagnosis not present

## 2021-08-27 DIAGNOSIS — H52203 Unspecified astigmatism, bilateral: Secondary | ICD-10-CM | POA: Diagnosis not present

## 2021-08-27 DIAGNOSIS — Z961 Presence of intraocular lens: Secondary | ICD-10-CM | POA: Diagnosis not present

## 2021-09-08 DIAGNOSIS — R350 Frequency of micturition: Secondary | ICD-10-CM | POA: Diagnosis not present

## 2021-11-12 DIAGNOSIS — I129 Hypertensive chronic kidney disease with stage 1 through stage 4 chronic kidney disease, or unspecified chronic kidney disease: Secondary | ICD-10-CM | POA: Diagnosis not present

## 2021-11-12 DIAGNOSIS — N182 Chronic kidney disease, stage 2 (mild): Secondary | ICD-10-CM | POA: Diagnosis not present

## 2021-11-12 DIAGNOSIS — E1122 Type 2 diabetes mellitus with diabetic chronic kidney disease: Secondary | ICD-10-CM | POA: Diagnosis not present

## 2021-11-12 DIAGNOSIS — E039 Hypothyroidism, unspecified: Secondary | ICD-10-CM | POA: Diagnosis not present

## 2021-11-12 DIAGNOSIS — E785 Hyperlipidemia, unspecified: Secondary | ICD-10-CM | POA: Diagnosis not present

## 2021-11-19 DIAGNOSIS — E039 Hypothyroidism, unspecified: Secondary | ICD-10-CM | POA: Diagnosis not present

## 2021-11-19 DIAGNOSIS — E785 Hyperlipidemia, unspecified: Secondary | ICD-10-CM | POA: Diagnosis not present

## 2021-11-19 DIAGNOSIS — E1122 Type 2 diabetes mellitus with diabetic chronic kidney disease: Secondary | ICD-10-CM | POA: Diagnosis not present

## 2021-11-19 DIAGNOSIS — N182 Chronic kidney disease, stage 2 (mild): Secondary | ICD-10-CM | POA: Diagnosis not present

## 2021-11-19 DIAGNOSIS — I129 Hypertensive chronic kidney disease with stage 1 through stage 4 chronic kidney disease, or unspecified chronic kidney disease: Secondary | ICD-10-CM | POA: Diagnosis not present

## 2021-11-19 DIAGNOSIS — Z0000 Encounter for general adult medical examination without abnormal findings: Secondary | ICD-10-CM | POA: Diagnosis not present

## 2021-11-19 DIAGNOSIS — M810 Age-related osteoporosis without current pathological fracture: Secondary | ICD-10-CM | POA: Diagnosis not present

## 2021-11-19 DIAGNOSIS — F17211 Nicotine dependence, cigarettes, in remission: Secondary | ICD-10-CM | POA: Diagnosis not present

## 2021-12-01 DIAGNOSIS — L309 Dermatitis, unspecified: Secondary | ICD-10-CM | POA: Diagnosis not present

## 2021-12-01 DIAGNOSIS — D485 Neoplasm of uncertain behavior of skin: Secondary | ICD-10-CM | POA: Diagnosis not present

## 2021-12-01 DIAGNOSIS — L900 Lichen sclerosus et atrophicus: Secondary | ICD-10-CM | POA: Diagnosis not present

## 2021-12-19 DIAGNOSIS — Z23 Encounter for immunization: Secondary | ICD-10-CM | POA: Diagnosis not present

## 2022-03-03 ENCOUNTER — Ambulatory Visit (INDEPENDENT_AMBULATORY_CARE_PROVIDER_SITE_OTHER): Payer: Medicare Other

## 2022-03-03 ENCOUNTER — Ambulatory Visit: Payer: Medicare Other | Admitting: Orthopedic Surgery

## 2022-03-03 DIAGNOSIS — R1031 Right lower quadrant pain: Secondary | ICD-10-CM | POA: Diagnosis not present

## 2022-03-04 ENCOUNTER — Encounter: Payer: Self-pay | Admitting: Orthopedic Surgery

## 2022-03-04 NOTE — Progress Notes (Signed)
Office Visit Note   Patient: Rachel Pierce           Date of Birth: [birth year]           MRN: [id number] Visit Date: 03/03/2022              Requested by: Merrilee Seashore, [address],  [place] [address] PCP: Merrilee Seashore, MD  Chief Complaint  Patient presents with   Right Leg - Pain      HPI: Patient is an 87 year old woman who states that she is fallen twice this month she currently ambulates with a cane she states she has groin pain on the right.  She denies any hip pain and denies any pain with moving her hip.  Patient states she has no pain with standing.  Assessment & Plan: Visit Diagnoses:  1. Right groin pain     Plan: Recommended continue with her cane continue Tylenol arthritis.  Follow-Up Instructions: Return if symptoms worsen or fail to improve.   Ortho Exam  Patient is alert, oriented, no adenopathy, well-dressed, normal affect, normal respiratory effort. Examination patient can get from a sitting to a standing position without problems.  She has no pain with passive internal/external rotation of the right hip.  Range of motion of both hips is symmetric.  Imaging: No results found. No images are attached to the encounter.  Labs: No results found for: "HGBA1C", "ESRSEDRATE", "CRP", "LABURIC", "REPTSTATUS", "GRAMSTAIN", "CULT", "LABORGA"   Lab Results  Component Value Date   ALBUMIN 4.3 07/26/2014    No results found for: "MG" No results found for: "VD25OH"  No results found for: "PREALBUMIN"    Latest Ref Rng & Units 07/26/2014   11:08 AM  CBC EXTENDED  WBC 4.0 - 10.5 K/uL 8.3   RBC 3.87 - 5.11 MIL/uL 4.72   Hemoglobin 12.0 - 15.0 g/dL 14.4   HCT 36.0 - 46.0 % 43.0   Platelets 150 - 400 K/uL 222      There is no height or weight on file to calculate BMI.  Orders:  Orders Placed This Encounter  Procedures   XR HIP UNILAT W OR W/O PELVIS 1V RIGHT   No orders of the defined types were placed in  this encounter.    Procedures: No procedures performed  Clinical Data: No additional findings.  ROS:  All other systems negative, except as noted in the HPI. Review of Systems  Objective: Vital Signs: There were no vitals taken for this visit.  Specialty Comments:  No specialty comments available.  PMFS History: Patient Active Problem List   Diagnosis Date Noted   Idiopathic chronic venous hypertension of both lower extremities with inflammation 06/07/2017   Total knee replacement status 08/01/2014   Past Medical History:  Diagnosis Date   Arthritis    Cancer ([place])    66 YRS AGO MALIGNANT MELANOMA LEG+ LT EAR   Cerebral aneurysm    X2   "PROBABLY WAS BORN WITH THEM, THEY ARE TINY"   Cystitis    GERD (gastroesophageal reflux disease)    Hypertension    Hypothyroidism     History reviewed. No pertinent family history.  Past Surgical History:  Procedure Laterality Date   CHOLECYSTECTOMY     SKIN MELANOMAS REMOVED     TOTAL KNEE ARTHROPLASTY Left 08/01/2014   Procedure: TOTAL KNEE ARTHROPLASTY;  Surgeon: Newt Minion, MD;  Location: [facility];  Service: Orthopedics;  Laterality: Left;   WISDOM TOOTH EXTRACTION  1 WEEK AGO REMOVED   Social History   Occupational History   Not on file  Tobacco Use   Smoking status: Former   Smokeless tobacco: Never  Substance and Sexual Activity   Alcohol use: Yes    Comment: OCCAS   Drug use: No   Sexual activity: Not on file

## 2022-04-08 DIAGNOSIS — E1122 Type 2 diabetes mellitus with diabetic chronic kidney disease: Secondary | ICD-10-CM | POA: Diagnosis not present

## 2022-04-08 DIAGNOSIS — N182 Chronic kidney disease, stage 2 (mild): Secondary | ICD-10-CM | POA: Diagnosis not present

## 2022-04-08 DIAGNOSIS — E785 Hyperlipidemia, unspecified: Secondary | ICD-10-CM | POA: Diagnosis not present

## 2022-04-08 DIAGNOSIS — E039 Hypothyroidism, unspecified: Secondary | ICD-10-CM | POA: Diagnosis not present

## 2022-04-08 DIAGNOSIS — I129 Hypertensive chronic kidney disease with stage 1 through stage 4 chronic kidney disease, or unspecified chronic kidney disease: Secondary | ICD-10-CM | POA: Diagnosis not present

## 2022-04-15 DIAGNOSIS — M810 Age-related osteoporosis without current pathological fracture: Secondary | ICD-10-CM | POA: Diagnosis not present

## 2022-04-15 DIAGNOSIS — I129 Hypertensive chronic kidney disease with stage 1 through stage 4 chronic kidney disease, or unspecified chronic kidney disease: Secondary | ICD-10-CM | POA: Diagnosis not present

## 2022-04-15 DIAGNOSIS — E1122 Type 2 diabetes mellitus with diabetic chronic kidney disease: Secondary | ICD-10-CM | POA: Diagnosis not present

## 2022-04-15 DIAGNOSIS — F17211 Nicotine dependence, cigarettes, in remission: Secondary | ICD-10-CM | POA: Diagnosis not present

## 2022-04-15 DIAGNOSIS — E039 Hypothyroidism, unspecified: Secondary | ICD-10-CM | POA: Diagnosis not present

## 2022-04-15 DIAGNOSIS — Z23 Encounter for immunization: Secondary | ICD-10-CM | POA: Diagnosis not present

## 2022-04-15 DIAGNOSIS — E785 Hyperlipidemia, unspecified: Secondary | ICD-10-CM | POA: Diagnosis not present

## 2022-04-15 DIAGNOSIS — N182 Chronic kidney disease, stage 2 (mild): Secondary | ICD-10-CM | POA: Diagnosis not present

## 2022-06-08 DIAGNOSIS — L820 Inflamed seborrheic keratosis: Secondary | ICD-10-CM | POA: Diagnosis not present

## 2022-06-08 DIAGNOSIS — Z85820 Personal history of malignant melanoma of skin: Secondary | ICD-10-CM | POA: Diagnosis not present

## 2022-06-08 DIAGNOSIS — L309 Dermatitis, unspecified: Secondary | ICD-10-CM | POA: Diagnosis not present

## 2022-06-08 DIAGNOSIS — Z85828 Personal history of other malignant neoplasm of skin: Secondary | ICD-10-CM | POA: Diagnosis not present

## 2022-06-08 DIAGNOSIS — D2272 Melanocytic nevi of left lower limb, including hip: Secondary | ICD-10-CM | POA: Diagnosis not present

## 2022-06-08 DIAGNOSIS — L570 Actinic keratosis: Secondary | ICD-10-CM | POA: Diagnosis not present

## 2022-06-08 DIAGNOSIS — D225 Melanocytic nevi of trunk: Secondary | ICD-10-CM | POA: Diagnosis not present

## 2022-06-08 DIAGNOSIS — L821 Other seborrheic keratosis: Secondary | ICD-10-CM | POA: Diagnosis not present

## 2022-08-19 DIAGNOSIS — N182 Chronic kidney disease, stage 2 (mild): Secondary | ICD-10-CM | POA: Diagnosis not present

## 2022-08-19 DIAGNOSIS — E039 Hypothyroidism, unspecified: Secondary | ICD-10-CM | POA: Diagnosis not present

## 2022-08-19 DIAGNOSIS — E1122 Type 2 diabetes mellitus with diabetic chronic kidney disease: Secondary | ICD-10-CM | POA: Diagnosis not present

## 2022-08-19 DIAGNOSIS — E785 Hyperlipidemia, unspecified: Secondary | ICD-10-CM | POA: Diagnosis not present

## 2022-08-19 DIAGNOSIS — I129 Hypertensive chronic kidney disease with stage 1 through stage 4 chronic kidney disease, or unspecified chronic kidney disease: Secondary | ICD-10-CM | POA: Diagnosis not present

## 2022-08-26 DIAGNOSIS — E785 Hyperlipidemia, unspecified: Secondary | ICD-10-CM | POA: Diagnosis not present

## 2022-08-26 DIAGNOSIS — I129 Hypertensive chronic kidney disease with stage 1 through stage 4 chronic kidney disease, or unspecified chronic kidney disease: Secondary | ICD-10-CM | POA: Diagnosis not present

## 2022-08-26 DIAGNOSIS — E039 Hypothyroidism, unspecified: Secondary | ICD-10-CM | POA: Diagnosis not present

## 2022-08-26 DIAGNOSIS — N182 Chronic kidney disease, stage 2 (mild): Secondary | ICD-10-CM | POA: Diagnosis not present

## 2022-08-26 DIAGNOSIS — M810 Age-related osteoporosis without current pathological fracture: Secondary | ICD-10-CM | POA: Diagnosis not present

## 2022-08-26 DIAGNOSIS — E1122 Type 2 diabetes mellitus with diabetic chronic kidney disease: Secondary | ICD-10-CM | POA: Diagnosis not present

## 2022-10-02 DIAGNOSIS — Z961 Presence of intraocular lens: Secondary | ICD-10-CM | POA: Diagnosis not present

## 2022-10-02 DIAGNOSIS — H52202 Unspecified astigmatism, left eye: Secondary | ICD-10-CM | POA: Diagnosis not present

## 2022-12-03 DIAGNOSIS — E785 Hyperlipidemia, unspecified: Secondary | ICD-10-CM | POA: Diagnosis not present

## 2022-12-03 DIAGNOSIS — E1122 Type 2 diabetes mellitus with diabetic chronic kidney disease: Secondary | ICD-10-CM | POA: Diagnosis not present

## 2022-12-03 DIAGNOSIS — E039 Hypothyroidism, unspecified: Secondary | ICD-10-CM | POA: Diagnosis not present

## 2022-12-03 DIAGNOSIS — I129 Hypertensive chronic kidney disease with stage 1 through stage 4 chronic kidney disease, or unspecified chronic kidney disease: Secondary | ICD-10-CM | POA: Diagnosis not present

## 2022-12-03 DIAGNOSIS — N182 Chronic kidney disease, stage 2 (mild): Secondary | ICD-10-CM | POA: Diagnosis not present

## 2023-04-30 DIAGNOSIS — Z23 Encounter for immunization: Secondary | ICD-10-CM | POA: Diagnosis not present

## 2023-04-30 DIAGNOSIS — E785 Hyperlipidemia, unspecified: Secondary | ICD-10-CM | POA: Diagnosis not present

## 2023-04-30 DIAGNOSIS — Z0000 Encounter for general adult medical examination without abnormal findings: Secondary | ICD-10-CM | POA: Diagnosis not present

## 2023-04-30 DIAGNOSIS — E039 Hypothyroidism, unspecified: Secondary | ICD-10-CM | POA: Diagnosis not present

## 2023-04-30 DIAGNOSIS — M810 Age-related osteoporosis without current pathological fracture: Secondary | ICD-10-CM | POA: Diagnosis not present

## 2023-04-30 DIAGNOSIS — E1122 Type 2 diabetes mellitus with diabetic chronic kidney disease: Secondary | ICD-10-CM | POA: Diagnosis not present

## 2023-10-26 DIAGNOSIS — L570 Actinic keratosis: Secondary | ICD-10-CM | POA: Diagnosis not present

## 2023-10-26 DIAGNOSIS — L820 Inflamed seborrheic keratosis: Secondary | ICD-10-CM | POA: Diagnosis not present

## 2023-11-26 DIAGNOSIS — N182 Chronic kidney disease, stage 2 (mild): Secondary | ICD-10-CM | POA: Diagnosis not present

## 2023-11-26 DIAGNOSIS — I129 Hypertensive chronic kidney disease with stage 1 through stage 4 chronic kidney disease, or unspecified chronic kidney disease: Secondary | ICD-10-CM | POA: Diagnosis not present

## 2023-11-26 DIAGNOSIS — E785 Hyperlipidemia, unspecified: Secondary | ICD-10-CM | POA: Diagnosis not present

## 2023-12-03 DIAGNOSIS — I129 Hypertensive chronic kidney disease with stage 1 through stage 4 chronic kidney disease, or unspecified chronic kidney disease: Secondary | ICD-10-CM | POA: Diagnosis not present

## 2023-12-03 DIAGNOSIS — E039 Hypothyroidism, unspecified: Secondary | ICD-10-CM | POA: Diagnosis not present

## 2023-12-03 DIAGNOSIS — E785 Hyperlipidemia, unspecified: Secondary | ICD-10-CM | POA: Diagnosis not present

## 2023-12-03 DIAGNOSIS — N182 Chronic kidney disease, stage 2 (mild): Secondary | ICD-10-CM | POA: Diagnosis not present

## 2023-12-03 DIAGNOSIS — E1122 Type 2 diabetes mellitus with diabetic chronic kidney disease: Secondary | ICD-10-CM | POA: Diagnosis not present

## 2023-12-03 DIAGNOSIS — M810 Age-related osteoporosis without current pathological fracture: Secondary | ICD-10-CM | POA: Diagnosis not present

## 2023-12-28 ENCOUNTER — Ambulatory Visit: Payer: Self-pay

## 2023-12-28 DIAGNOSIS — R300 Dysuria: Secondary | ICD-10-CM | POA: Diagnosis not present

## 2023-12-28 NOTE — Telephone Encounter (Signed)
 FYI Only or Action Required?: FYI only for provider.  Patient was last seen in primary care on New [place].  Called Nurse Triage reporting Urinary Tract Infection.  Symptoms began several days ago.  Interventions attempted: OTC medications: AZO.  Symptoms are: unchanged.  Triage Disposition: See HCP Within 4 Hours (Or PCP Triage)  Patient/caregiver understands and will follow disposition?: Unsure         Copied from CRM #[id number]. Topic: Clinical - Red Word Triage >> Dec 28, 2023  8:37 AM Rea ORN wrote: Red Word that prompted transfer to Nurse Triage: UTI sx: Pain, burning, increased frequency [facility] Reason for Disposition  [1] SEVERE pain with urination (e.g., excruciating) AND [2] not improved after 2 hours of pain medicine    Pt has not established care with [facility] yet, and does have upcoming appt. Triager advised that pt cannot have AV before establishing care and offered to schedule with [facility], but pt declined and said I can handle it from here.  Answer Assessment - Initial Assessment Questions 1. SEVERITY: How bad is the pain?  (e.g., Scale 1-10; mild, moderate, or severe)     9/10 2. FREQUENCY: How many times have you had painful urination today?      3-4 3. PATTERN: Is pain present every time you urinate or just sometimes?      yes 4. ONSET: When did the painful urination start?      2 days ago - taking OTC AZO 5. FEVER: Do you have a fever? If Yes, ask: What is your temperature, how was it measured, and when did it start?     denies 6. PAST UTI: Have you had a urine infection before? If Yes, ask: When was the last time? and What happened that time?      Yes, last had UTI several years ago. 7. CAUSE: What do you think is causing the painful urination?  (e.g., UTI, scratch, Herpes sore)     UTI 8. OTHER SYMPTOMS: Do you have any other symptoms? (e.g., blood in urine, flank pain, genital sores, urgency, vaginal discharge)     Urgency,  frequency. 9. PREGNANCY: Is there any chance you are pregnant? When was your last menstrual period?     N/a  Protocols used: Urination Pain - Female-A-AH

## 2024-01-18 ENCOUNTER — Ambulatory Visit: Admitting: Family

## 2024-01-18 ENCOUNTER — Encounter: Payer: Self-pay | Admitting: Family

## 2024-01-18 VITALS — BP 110/60 | HR 65 | Temp 97.9°F | Ht 61.0 in | Wt 146.2 lb

## 2024-01-18 DIAGNOSIS — E7849 Other hyperlipidemia: Secondary | ICD-10-CM | POA: Diagnosis not present

## 2024-01-18 DIAGNOSIS — R32 Unspecified urinary incontinence: Secondary | ICD-10-CM | POA: Insufficient documentation

## 2024-01-18 DIAGNOSIS — B369 Superficial mycosis, unspecified: Secondary | ICD-10-CM

## 2024-01-18 DIAGNOSIS — E039 Hypothyroidism, unspecified: Secondary | ICD-10-CM | POA: Insufficient documentation

## 2024-01-18 DIAGNOSIS — K219 Gastro-esophageal reflux disease without esophagitis: Secondary | ICD-10-CM | POA: Insufficient documentation

## 2024-01-18 MED ORDER — CLOTRIMAZOLE 1 % EX CREA: 1.0000 | TOPICAL_CREAM | Freq: Two times a day (BID) | CUTANEOUS | 1 refills | Status: DC

## 2024-01-18 NOTE — Progress Notes (Signed)
 New Patient Office Visit  Subjective:  Patient ID: Rachel Pierce, female    DOB: 1934-08-25  Age: 89 y.o. MRN: [id number]  CC:  Chief Complaint  Patient presents with   New Patient (Initial Visit)   Urinary Frequency    Pt c/o urinary frequency, present for years. Pt has seen a urologist in the past.    HPI VIVIAN Pierce presents for establishing care today. Discussed the use of AI scribe software for clinical note transcription with the patient, who gave verbal consent to proceed.  History of Present Illness Rachel Pierce is an 89 year old female who presents for establishment of care and medication management.  She takes levothyroxine  for thyroid  issues with no recent changes in her regimen. Her current medications include metoprolol , omeprazole, pravastatin , and glucosamine. She was prescribed Januvia but does not recall a diabetes diagnosis.  She experiences urinary incontinence, wearing pads constantly due to leakage and staying wet most of the time. She reports itching in the vaginal area and uses Benadryl for relief. She recalls a recent bladder tract infection but has not had recent urology consultations.  She lives locally, drives herself, her son helps out, and her husband does not drive.  Assessment & Plan Urinary incontinence Chronic urinary incontinence with frequent leakage, leading to constant pad use and vaginal itching due to moisture. Previous medications for overactive bladder were ineffective, seen by [facility] one time in past. - Review medical records when available to check on meds already tried. - Advise use of dimethicone barrier cream to prevent skin breakdown from urine. - Suggest visiting a [facility] medical supply store for barrier cream. She has been here for supplies for her husband.  Vulvar candidiasis Vaginal itching likely due to fungal infection secondary to chronic moisture from urinary incontinence. - Prescribe clotrimazole  cream for treatment of fungal infection. - Advised to look for a barrier cream to protect skin from urine, something with Dimethicone, can find at [facility] medical supply - Call office if sx are not improving.  Gastroesophageal reflux disease Chronic heartburn managed with omeprazole 20mg  qd, no refill needed today. - Continue Omeprazole daily. - F/U in 4 months  Hypothyroidism Hypothyroidism managed with levothyroxine . Recent lab work from September is pending transfer. Denies any dose change of her medication, - Request medical records from previous provider to review recent lab results. - Continue Levothyroxine  daily. - F/U 11/2024 for labs  Hyperlipidemia Hyperlipidemia managed with pravastatin  20mg  daily. Labs done in September. - No refills needed today - F/U in 11/2024 for fasting lab recheck  General Health Maintenance Establishing care with a new provider. Medical records from previous provider are pending transfer. - Request medical records from previous provider to ensure continuity of care.   Subjective:    Outpatient Medications Prior to Visit  Medication Sig Dispense Refill   acetaminophen  (TYLENOL ) 500 MG tablet Take 1,000 mg by mouth 2 (two) times daily as needed (pain).     ARTIFICIAL TEAR SOLUTION OP Place 1 drop into the right eye daily as needed (dryness).     Bismuth Subsalicylate (PEPTO BISMOL PO) Take by mouth as needed.     glucosamine-chondroitin 500-400 MG tablet Take 1 tablet by mouth 3 (three) times daily.     levothyroxine  (SYNTHROID , LEVOTHROID) 88 MCG tablet Take 1 tablet by mouth daily before breakfast.   1   metoprolol  succinate (TOPROL -XL) 25 MG 24 hr tablet Take 12.5 mg by mouth daily.  1   omeprazole (PRILOSEC) 20 MG  capsule Take 20 mg by mouth daily.  1   pravastatin  (PRAVACHOL ) 20 MG tablet Take 20 mg by mouth daily.  1   acetaminophen  (TYLENOL ) 650 MG CR tablet Take 1,300 mg by mouth daily as needed (arthritis pain).     aspirin  EC 325 MG  tablet Take 1 tablet (325 mg total) by mouth daily. 30 tablet 0   diphenhydrAMINE (BENADRYL) 25 MG tablet Take 25 mg by mouth 2 (two) times daily as needed for itching or allergies.     HYDROcodone -acetaminophen  (NORCO) 5-325 MG per tablet Take 1 tablet by mouth every 6 (six) hours as needed. 30 tablet 0   Misc Natural Products (OSTEO BI-FLEX JOINT SHIELD) TABS Take 2 tablets by mouth daily.     Multiple Vitamins-Minerals (MULTIVITAMIN ADULT) TABS Take 1 tablet by mouth daily.     oxyCODONE -acetaminophen  (ROXICET) 5-325 MG per tablet Take 1 tablet by mouth every 4 (four) hours as needed for severe pain. 60 tablet 0   vitamin B-12 (CYANOCOBALAMIN) 250 MCG tablet Take 250 mcg by mouth daily.     No facility-administered medications prior to visit.   Past Medical History:  Diagnosis Date   Arthritis    Cancer (HCC)    40 YRS AGO MALIGNANT MELANOMA LEG+ LT EAR   Cerebral aneurysm    X2   PROBABLY WAS BORN WITH THEM, THEY ARE TINY   Cystitis    GERD (gastroesophageal reflux disease)    Hypertension    Hypothyroidism    Past Surgical History:  Procedure Laterality Date   CHOLECYSTECTOMY     SKIN MELANOMAS REMOVED     TOTAL KNEE ARTHROPLASTY Left 08/01/2014   Procedure: TOTAL KNEE ARTHROPLASTY;  Surgeon: Jerona Harden GAILS, MD;  Location: [facility] OR;  Service: Orthopedics;  Laterality: Left;   WISDOM TOOTH EXTRACTION     1 WEEK AGO REMOVED    Objective:   Today's Vitals: BP 110/60 (BP Location: Left Arm, Patient Position: Sitting, Cuff Size: Normal)   Pulse 65   Temp 97.9 F (36.6 C) (Temporal)   Ht 5' 1 (1.549 m)   Wt 146 lb 4 oz (66.3 kg)   SpO2 97%   BMI 27.63 kg/m   Physical Exam Vitals and nursing note reviewed.  Constitutional:      Appearance: Normal appearance.  Cardiovascular:     Rate and Rhythm: Normal rate and regular rhythm.  Pulmonary:     Effort: Pulmonary effort is normal.     Breath sounds: Normal breath sounds.  Musculoskeletal:        General: Normal range of  motion.  Skin:    General: Skin is warm and dry.  Neurological:     Mental Status: She is alert.  Psychiatric:        Mood and Affect: Mood normal.        Behavior: Behavior normal.     Meds ordered this encounter  Medications   clotrimazole (LOTRIMIN) 1 % cream    Sig: Apply 1 Application topically 2 (two) times daily. Apply to vaginal area for itching.    Dispense:  35 g    Refill:  1    Supervising Provider:   ANDY, CAMILLE L [[id number]]    Lucius Krabbe, NP

## 2024-01-18 NOTE — Patient Instructions (Addendum)
 Welcome to [facility], It was a pleasure meeting you today!    As discussed, I have sent your antifungal for your vaginal itching to your pharmacy. Once it is better then look for a barrier cream to use to prevent, that includes Dimethicone. Ask someone at the [facility] supply store.  Please schedule a 4 month follow up visit today.    PLEASE NOTE: If you had any LAB tests please let us  know if you have not heard back within a few days. You may see your results on MyChart before we have a chance to review them but we will give you a call once they are reviewed by us . If we ordered any REFERRALS today, please let us  know if you have not heard from their office within the next week.  Let us  know through MyChart if you are needing REFILLS, or have your pharmacy send us  the request. You can also use MyChart to communicate with me or any office staff.  Please try these tips to maintain a healthy lifestyle: It is important that you exercise regularly at least 30 minutes 5 times a week. Think about what you will eat, plan ahead. Choose whole foods, & think  clean, green, fresh or frozen over canned, processed or packaged foods which are more sugary, salty, and fatty. 70 to 75% of food eaten should be fresh vegetables and protein. 2-3  meals daily with healthy snacks between meals, but must be whole fruit, protein or vegetables. Aim to eat over a 10 hour period when you are active, for example, 7am to 5pm, and then STOP after your last meal of the day, drinking only water.  Shorter eating windows, 6-8 hours, are showing benefits in heart disease and blood sugar regulation. Drink water every day! Shoot for 64 ounces daily = 8 cups, no other drink is as healthy! Fruit juice is best enjoyed in a healthy way, by EATING the fruit.

## 2024-02-03 ENCOUNTER — Ambulatory Visit: Admitting: Orthopedic Surgery

## 2024-02-03 ENCOUNTER — Other Ambulatory Visit (INDEPENDENT_AMBULATORY_CARE_PROVIDER_SITE_OTHER): Payer: Self-pay

## 2024-02-03 DIAGNOSIS — M79605 Pain in left leg: Secondary | ICD-10-CM | POA: Diagnosis not present

## 2024-02-03 DIAGNOSIS — M25562 Pain in left knee: Secondary | ICD-10-CM

## 2024-02-03 DIAGNOSIS — M5410 Radiculopathy, site unspecified: Secondary | ICD-10-CM | POA: Diagnosis not present

## 2024-02-03 MED ORDER — PREDNISONE 10 MG PO TABS: 10.0000 mg | ORAL_TABLET | Freq: Every day | ORAL | 0 refills | Status: AC

## 2024-02-07 ENCOUNTER — Encounter: Payer: Self-pay | Admitting: Orthopedic Surgery

## 2024-02-07 ENCOUNTER — Telehealth: Payer: Self-pay | Admitting: Orthopedic Surgery

## 2024-02-07 ENCOUNTER — Other Ambulatory Visit: Payer: Self-pay | Admitting: Orthopedic Surgery

## 2024-02-07 MED ORDER — HYDROCODONE-ACETAMINOPHEN 5-325 MG PO TABS: 1.0000 | ORAL_TABLET | Freq: Four times a day (QID) | ORAL | 0 refills | Status: DC | PRN

## 2024-02-07 NOTE — Progress Notes (Signed)
 Office Visit Note   Patient: Rachel Pierce           Date of Birth: Jul 15, 1934           MRN: [id number] Visit Date: 02/03/2024              Requested by: Lucius Krabbe, NP [address] [place],  [place] [address] PCP: Lucius Krabbe, NP  Chief Complaint  Patient presents with   Left Leg - Pain      HPI: Discussed the use of AI scribe software for clinical note transcription with the patient, who gave verbal consent to proceed.  History of Present Illness Rachel Pierce is an 89 year old female who presents with acute left leg pain and inability to bear weight.  She went to bed on Tuesday night without any issues, but upon waking on Wednesday morning, she was unable to put her left foot down on the ground due to pain. The pain radiates from her lumbar spine to her left knee. She is unable to lift her left leg without assistance and experiences pain when attempting to straighten it.  She has a history of left knee replacement, but currently, the pain is more pronounced in her back and radiates to her knee. The pain worsens with movement, and she did some excessive walking the day before the onset of symptoms.  Her husband, Dallas, mentions that she did not sleep well the previous night and stayed in a chair all night long. She has been married for 67 years, and she has been taking care of him, which may have contributed to her current condition.     Assessment & Plan: Visit Diagnoses:  1. Pain in left leg   2. Acute pain of left knee     Plan: Assessment and Plan Assessment & Plan Sciatica due to lumbar spondylosis and spondylolisthesis Acute sciatica with radicular pain from lumbar spine to left knee, likely due to nerve irritation from advanced degenerative disc disease and spondylolisthesis at L5-S1. No acute deformity on lumbar spine radiographs. - Prescribed prednisone 10 mg with breakfast daily until symptoms improve. If no relief, increase to 20  mg for a couple of days, then return to 10 mg. - Advised use of a heating pad on the back for symptomatic relief, cautioning against burns. - Scheduled follow-up appointment in four weeks. - Instructed to call if symptoms change significantly.  Left knee pain status post total knee arthroplasty Left knee pain with global tenderness and pain on passive range of motion. Radiographs show stable total knee arthroplasty with no evidence of loosening or fractures. Pain likely secondary to sciatica rather than knee pathology. - Ordered x-rays of the left knee to assess for any changes.      Follow-Up Instructions: No follow-ups on file.   Ortho Exam  Patient is alert, oriented, no adenopathy, well-dressed, normal affect, normal respiratory effort. Physical Exam MUSCULOSKELETAL: Left knee globally tender to palpation, pain with passive range of motion. Weakness with left hip flexion compared to right. Radicular pain from lumbar spine to left knee. NEUROLOGICAL: No focal motor weakness with plantar flexion and dorsiflexion of foot.      Imaging: No results found. No images are attached to the encounter.  Labs: No results found for: HGBA1C, ESRSEDRATE, CRP, LABURIC, REPTSTATUS, GRAMSTAIN, CULT, LABORGA   Lab Results  Component Value Date   ALBUMIN 4.3 07/26/2014    No results found for: MG No results found for: VD25OH  No  results found for: PREALBUMIN    Latest Ref Rng & Units 07/26/2014   11:08 AM  CBC EXTENDED  WBC 4.0 - 10.5 K/uL 8.3   RBC 3.87 - 5.11 MIL/uL 4.72   Hemoglobin 12.0 - 15.0 g/dL 85.5   HCT 63.9 - 53.9 % 43.0   Platelets 150 - 400 K/uL 222      There is no height or weight on file to calculate BMI.  Orders:  Orders Placed This Encounter  Procedures   XR Lumbar Spine 2-3 Views   XR Knee 1-2 Views Left   Meds ordered this encounter  Medications   predniSONE (DELTASONE) 10 MG tablet    Sig: Take 1 tablet (10 mg total) by mouth  daily with breakfast.    Dispense:  30 tablet    Refill:  0     Procedures: No procedures performed  Clinical Data: No additional findings.  ROS:  All other systems negative, except as noted in the HPI. Review of Systems  Objective: Vital Signs: There were no vitals taken for this visit.  Specialty Comments:  No specialty comments available.  PMFS History: Patient Active Problem List   Diagnosis Date Noted   Acquired hypothyroidism 01/18/2024   Other hyperlipidemia 01/18/2024   Urinary incontinence 01/18/2024   Gastroesophageal reflux disease without esophagitis 01/18/2024   Idiopathic chronic venous hypertension of both lower extremities with inflammation 06/07/2017   Total knee replacement status 08/01/2014   Past Medical History:  Diagnosis Date   Arthritis    Cancer (HCC)    40 YRS AGO MALIGNANT MELANOMA LEG+ LT EAR   Cerebral aneurysm    X2   PROBABLY WAS BORN WITH THEM, THEY ARE TINY   Cystitis    GERD (gastroesophageal reflux disease)    Hypertension    Hypothyroidism     Family History  Problem Relation Age of Onset   Ovarian cancer Mother    Alcohol abuse Father    Stroke Sister     Past Surgical History:  Procedure Laterality Date   CHOLECYSTECTOMY     SKIN MELANOMAS REMOVED     TOTAL KNEE ARTHROPLASTY Left 08/01/2014   Procedure: TOTAL KNEE ARTHROPLASTY;  Surgeon: Jerona Harden GAILS, MD;  Location: [facility] OR;  Service: Orthopedics;  Laterality: Left;   WISDOM TOOTH EXTRACTION     1 WEEK AGO REMOVED   Social History   Occupational History   Not on file  Tobacco Use   Smoking status: Former   Smokeless tobacco: Never  Vaping Use   Vaping status: Not on file  Substance and Sexual Activity   Alcohol use: Not Currently    Comment: OCCAS- 1 a year   Drug use: No   Sexual activity: Not Currently    Birth control/protection: None

## 2024-02-07 NOTE — Telephone Encounter (Signed)
I called and lm on vm for pt to advise of message below. To call with questions.  

## 2024-02-07 NOTE — Telephone Encounter (Signed)
 Pt called saying that she is in a lot of pain and would like for the Dr. To call her in something for pain if possible. Pharmacy [facility] at  [address], [place]. Call back number is  [phone]

## 2024-02-07 NOTE — Telephone Encounter (Signed)
 Please see message below. Pt was in the office last week. Asking for something for pain.

## 2024-02-09 ENCOUNTER — Telehealth: Payer: Self-pay | Admitting: Orthopedic Surgery

## 2024-02-09 DIAGNOSIS — M5410 Radiculopathy, site unspecified: Secondary | ICD-10-CM

## 2024-02-09 MED ORDER — OXYCODONE-ACETAMINOPHEN 5-325 MG PO TABS: 1.0000 | ORAL_TABLET | Freq: Four times a day (QID) | ORAL | 0 refills | Status: DC | PRN

## 2024-02-09 NOTE — Telephone Encounter (Signed)
 Patient called and said her medication isn't working and she is in severe pain. CB#[phone]

## 2024-02-09 NOTE — Telephone Encounter (Signed)
 Dr. Duda gave rx for hydrocodone . She was in the office last week and said that she is in sever pain the medication is not working. What do you advise the pt to do?

## 2024-02-09 NOTE — Telephone Encounter (Signed)
 I called pt to advise of message below. She will await MRI sch and call if she has an y questions.

## 2024-02-09 NOTE — Addendum Note (Signed)
 Addended by: Sophie Quiles R on: 02/09/2024 04:23 PM   Modules accepted: Orders

## 2024-02-09 NOTE — Telephone Encounter (Signed)
 I sent percocet and ordered lumbar mri, could send to [facility] after mri

## 2024-02-12 ENCOUNTER — Inpatient Hospital Stay (HOSPITAL_COMMUNITY)
Admission: EM | Admit: 2024-02-12 | Discharge: 2024-02-16 | DRG: 537 | Disposition: A | Discharge status: Skilled Nursing Facility | Attending: Internal Medicine | Admitting: Internal Medicine

## 2024-02-12 ENCOUNTER — Encounter (HOSPITAL_COMMUNITY): Payer: Self-pay | Admitting: Emergency Medicine

## 2024-02-12 ENCOUNTER — Emergency Department (HOSPITAL_COMMUNITY)

## 2024-02-12 ENCOUNTER — Other Ambulatory Visit: Payer: Self-pay

## 2024-02-12 DIAGNOSIS — M7918 Myalgia, other site: Secondary | ICD-10-CM | POA: Diagnosis present

## 2024-02-12 DIAGNOSIS — Z7984 Long term (current) use of oral hypoglycemic drugs: Secondary | ICD-10-CM

## 2024-02-12 DIAGNOSIS — T148XXA Other injury of unspecified body region, initial encounter: Principal | ICD-10-CM

## 2024-02-12 DIAGNOSIS — R509 Fever, unspecified: Secondary | ICD-10-CM | POA: Diagnosis present

## 2024-02-12 DIAGNOSIS — Z811 Family history of alcohol abuse and dependence: Secondary | ICD-10-CM

## 2024-02-12 DIAGNOSIS — L89151 Pressure ulcer of sacral region, stage 1: Secondary | ICD-10-CM | POA: Diagnosis present

## 2024-02-12 DIAGNOSIS — M25562 Pain in left knee: Secondary | ICD-10-CM | POA: Diagnosis present

## 2024-02-12 DIAGNOSIS — S76012A Strain of muscle, fascia and tendon of left hip, initial encounter: Secondary | ICD-10-CM | POA: Diagnosis present

## 2024-02-12 DIAGNOSIS — Z91040 Latex allergy status: Secondary | ICD-10-CM

## 2024-02-12 DIAGNOSIS — Z85820 Personal history of malignant melanoma of skin: Secondary | ICD-10-CM | POA: Diagnosis not present

## 2024-02-12 DIAGNOSIS — M5126 Other intervertebral disc displacement, lumbar region: Secondary | ICD-10-CM | POA: Diagnosis present

## 2024-02-12 DIAGNOSIS — E039 Hypothyroidism, unspecified: Secondary | ICD-10-CM | POA: Diagnosis present

## 2024-02-12 DIAGNOSIS — N390 Urinary tract infection, site not specified: Secondary | ICD-10-CM | POA: Diagnosis present

## 2024-02-12 DIAGNOSIS — M1612 Unilateral primary osteoarthritis, left hip: Secondary | ICD-10-CM | POA: Diagnosis present

## 2024-02-12 DIAGNOSIS — Z96652 Presence of left artificial knee joint: Secondary | ICD-10-CM | POA: Diagnosis present

## 2024-02-12 DIAGNOSIS — M25552 Pain in left hip: Secondary | ICD-10-CM | POA: Diagnosis not present

## 2024-02-12 DIAGNOSIS — I10 Essential (primary) hypertension: Secondary | ICD-10-CM | POA: Diagnosis present

## 2024-02-12 DIAGNOSIS — E7849 Other hyperlipidemia: Secondary | ICD-10-CM | POA: Diagnosis present

## 2024-02-12 DIAGNOSIS — Z794 Long term (current) use of insulin: Secondary | ICD-10-CM | POA: Diagnosis not present

## 2024-02-12 DIAGNOSIS — N3000 Acute cystitis without hematuria: Secondary | ICD-10-CM | POA: Diagnosis not present

## 2024-02-12 DIAGNOSIS — M5432 Sciatica, left side: Secondary | ICD-10-CM | POA: Diagnosis present

## 2024-02-12 DIAGNOSIS — L89152 Pressure ulcer of sacral region, stage 2: Secondary | ICD-10-CM | POA: Diagnosis not present

## 2024-02-12 DIAGNOSIS — Z1619 Resistance to other specified beta lactam antibiotics: Secondary | ICD-10-CM | POA: Diagnosis present

## 2024-02-12 DIAGNOSIS — M4317 Spondylolisthesis, lumbosacral region: Secondary | ICD-10-CM | POA: Diagnosis present

## 2024-02-12 DIAGNOSIS — Z66 Do not resuscitate: Secondary | ICD-10-CM | POA: Diagnosis present

## 2024-02-12 DIAGNOSIS — Z8041 Family history of malignant neoplasm of ovary: Secondary | ICD-10-CM

## 2024-02-12 DIAGNOSIS — Z823 Family history of stroke: Secondary | ICD-10-CM

## 2024-02-12 DIAGNOSIS — I129 Hypertensive chronic kidney disease with stage 1 through stage 4 chronic kidney disease, or unspecified chronic kidney disease: Secondary | ICD-10-CM | POA: Diagnosis present

## 2024-02-12 DIAGNOSIS — E1122 Type 2 diabetes mellitus with diabetic chronic kidney disease: Secondary | ICD-10-CM | POA: Diagnosis present

## 2024-02-12 DIAGNOSIS — Z79899 Other long term (current) drug therapy: Secondary | ICD-10-CM

## 2024-02-12 DIAGNOSIS — Z87892 Personal history of anaphylaxis: Secondary | ICD-10-CM

## 2024-02-12 DIAGNOSIS — L8992 Pressure ulcer of unspecified site, stage 2: Secondary | ICD-10-CM | POA: Diagnosis present

## 2024-02-12 DIAGNOSIS — Z882 Allergy status to sulfonamides status: Secondary | ICD-10-CM

## 2024-02-12 DIAGNOSIS — M810 Age-related osteoporosis without current pathological fracture: Secondary | ICD-10-CM | POA: Diagnosis present

## 2024-02-12 DIAGNOSIS — B9620 Unspecified Escherichia coli [E. coli] as the cause of diseases classified elsewhere: Secondary | ICD-10-CM | POA: Diagnosis present

## 2024-02-12 DIAGNOSIS — Z79890 Hormone replacement therapy (postmenopausal): Secondary | ICD-10-CM

## 2024-02-12 DIAGNOSIS — K219 Gastro-esophageal reflux disease without esophagitis: Secondary | ICD-10-CM | POA: Diagnosis present

## 2024-02-12 DIAGNOSIS — Z87891 Personal history of nicotine dependence: Secondary | ICD-10-CM

## 2024-02-12 DIAGNOSIS — R1022 Pelvic and perineal pain left side: Secondary | ICD-10-CM | POA: Diagnosis present

## 2024-02-12 DIAGNOSIS — L89312 Pressure ulcer of right buttock, stage 2: Secondary | ICD-10-CM | POA: Diagnosis present

## 2024-02-12 DIAGNOSIS — N182 Chronic kidney disease, stage 2 (mild): Secondary | ICD-10-CM | POA: Diagnosis present

## 2024-02-12 DIAGNOSIS — E119 Type 2 diabetes mellitus without complications: Secondary | ICD-10-CM | POA: Diagnosis not present

## 2024-02-12 DIAGNOSIS — Z1152 Encounter for screening for COVID-19: Secondary | ICD-10-CM | POA: Diagnosis not present

## 2024-02-12 DIAGNOSIS — Z885 Allergy status to narcotic agent status: Secondary | ICD-10-CM

## 2024-02-12 DIAGNOSIS — M4800 Spinal stenosis, site unspecified: Secondary | ICD-10-CM | POA: Diagnosis present

## 2024-02-12 DIAGNOSIS — M25452 Effusion, left hip: Secondary | ICD-10-CM | POA: Diagnosis present

## 2024-02-12 DIAGNOSIS — E875 Hyperkalemia: Secondary | ICD-10-CM | POA: Diagnosis not present

## 2024-02-12 LAB — CBC
HCT: 45.7 % (ref 36.0–46.0)
Hemoglobin: 15.2 g/dL — ABNORMAL HIGH (ref 12.0–15.0)
MCH: 29.7 pg (ref 26.0–34.0)
MCHC: 33.3 g/dL (ref 30.0–36.0)
MCV: 89.4 fL (ref 80.0–100.0)
Platelets: 356 K/uL (ref 150–400)
RBC: 5.11 MIL/uL (ref 3.87–5.11)
RDW: 12.3 % (ref 11.5–15.5)
WBC: 12.2 K/uL — ABNORMAL HIGH (ref 4.0–10.5)
nRBC: 0 % (ref 0.0–0.2)

## 2024-02-12 LAB — BASIC METABOLIC PANEL WITH GFR
Anion gap: 13 (ref 5–15)
BUN: 21 mg/dL (ref 8–23)
CO2: 25 mmol/L (ref 22–32)
Calcium: 10 mg/dL (ref 8.9–10.3)
Chloride: 99 mmol/L (ref 98–111)
Creatinine, Ser: 0.74 mg/dL (ref 0.44–1.00)
GFR, Estimated: >60 mL/min (ref >60)
Glucose, Bld: 94 mg/dL (ref 70–99)
Potassium: 3.6 mmol/L (ref 3.5–5.1)
Sodium: 136 mmol/L (ref 135–145)

## 2024-02-12 LAB — SEDIMENTATION RATE: Sed Rate: 68 mm/h — ABNORMAL HIGH (ref 0–22)

## 2024-02-12 LAB — RESP PANEL BY RT-PCR (RSV, FLU A&B, COVID)  RVPGX2
Influenza A by PCR: NEGATIVE
Influenza B by PCR: NEGATIVE
Resp Syncytial Virus by PCR: NEGATIVE
SARS Coronavirus 2 by RT PCR: NEGATIVE

## 2024-02-12 LAB — GLUCOSE, CAPILLARY: Glucose-Capillary: 175 mg/dL — ABNORMAL HIGH (ref 70–99)

## 2024-02-12 LAB — URINALYSIS, W/ REFLEX TO CULTURE (INFECTION SUSPECTED)

## 2024-02-12 MED ORDER — PRAVASTATIN SODIUM 20 MG PO TABS
20.0000 mg | ORAL_TABLET | Freq: Every day | ORAL | Status: DC
  Administered 2024-02-13 – 2024-02-16 (×4): 20 mg via ORAL
  Filled 2024-02-12 (×4): qty 1

## 2024-02-12 MED ORDER — PANTOPRAZOLE SODIUM 40 MG PO TBEC
40.0000 mg | DELAYED_RELEASE_TABLET | Freq: Every day | ORAL | Status: DC
  Administered 2024-02-13 – 2024-02-16 (×4): 40 mg via ORAL
  Filled 2024-02-12 (×4): qty 1

## 2024-02-12 MED ORDER — METHOCARBAMOL 1000 MG/10ML IJ SOLN
500.0000 mg | Freq: Four times a day (QID) | INTRAMUSCULAR | Status: DC | PRN
  Administered 2024-02-12: 500 mg via INTRAVENOUS
  Filled 2024-02-12 (×2): qty 10

## 2024-02-12 MED ORDER — ACETAMINOPHEN 325 MG PO TABS
650.0000 mg | ORAL_TABLET | Freq: Four times a day (QID) | ORAL | Status: DC | PRN
  Administered 2024-02-13: 650 mg via ORAL
  Filled 2024-02-12: qty 2

## 2024-02-12 MED ORDER — ONDANSETRON HCL 4 MG/2ML IJ SOLN
4.0000 mg | Freq: Once | INTRAMUSCULAR | Status: AC
  Administered 2024-02-12: 4 mg via INTRAVENOUS
  Filled 2024-02-12: qty 2

## 2024-02-12 MED ORDER — SENNA 8.6 MG PO TABS
1.0000 | ORAL_TABLET | Freq: Two times a day (BID) | ORAL | Status: DC
  Administered 2024-02-12 – 2024-02-16 (×8): 8.6 mg via ORAL
  Filled 2024-02-12 (×9): qty 1

## 2024-02-12 MED ORDER — ONDANSETRON HCL 4 MG PO TABS: 4.0000 mg | ORAL_TABLET | Freq: Four times a day (QID) | ORAL | Status: DC | PRN

## 2024-02-12 MED ORDER — FENTANYL CITRATE (PF) 50 MCG/ML IJ SOSY
50.0000 ug | PREFILLED_SYRINGE | Freq: Once | INTRAMUSCULAR | Status: AC
  Administered 2024-02-12: 50 ug via INTRAVENOUS
  Filled 2024-02-12: qty 1

## 2024-02-12 MED ORDER — POLYETHYLENE GLYCOL 3350 17 G PO PACK: 17.0000 g | PACK | Freq: Every day | ORAL | Status: DC | PRN

## 2024-02-12 MED ORDER — SODIUM CHLORIDE 0.9 % IV SOLN: INTRAVENOUS | Status: DC

## 2024-02-12 MED ORDER — HYDROCODONE-ACETAMINOPHEN 5-325 MG PO TABS
1.0000 | ORAL_TABLET | ORAL | Status: DC | PRN
  Administered 2024-02-12 – 2024-02-14 (×3): 2 via ORAL
  Filled 2024-02-12 (×3): qty 2

## 2024-02-12 MED ORDER — ONDANSETRON HCL 4 MG/2ML IJ SOLN
4.0000 mg | Freq: Four times a day (QID) | INTRAMUSCULAR | Status: DC | PRN
  Administered 2024-02-13: 4 mg via INTRAVENOUS
  Filled 2024-02-12: qty 2

## 2024-02-12 MED ORDER — FENTANYL CITRATE (PF) 50 MCG/ML IJ SOSY: 12.5000 ug | PREFILLED_SYRINGE | INTRAMUSCULAR | Status: DC | PRN

## 2024-02-12 MED ORDER — METOPROLOL SUCCINATE ER 25 MG PO TB24
12.5000 mg | ORAL_TABLET | Freq: Every day | ORAL | Status: DC
  Administered 2024-02-13 – 2024-02-16 (×4): 12.5 mg via ORAL
  Filled 2024-02-12 (×4): qty 1

## 2024-02-12 MED ORDER — INSULIN ASPART 100 UNIT/ML IJ SOLN
0.0000 [IU] | Freq: Three times a day (TID) | INTRAMUSCULAR | Status: DC
  Administered 2024-02-13: 1 [IU] via SUBCUTANEOUS
  Administered 2024-02-13: 3 [IU] via SUBCUTANEOUS
  Administered 2024-02-14 – 2024-02-15 (×5): 1 [IU] via SUBCUTANEOUS
  Filled 2024-02-12 (×2): qty 1
  Filled 2024-02-12: qty 3
  Filled 2024-02-12 (×3): qty 1

## 2024-02-12 MED ORDER — OXYCODONE-ACETAMINOPHEN 5-325 MG PO TABS
1.0000 | ORAL_TABLET | Freq: Four times a day (QID) | ORAL | Status: DC | PRN
  Administered 2024-02-14: 1 via ORAL
  Filled 2024-02-12: qty 1

## 2024-02-12 MED ORDER — PREDNISONE 20 MG PO TABS
40.0000 mg | ORAL_TABLET | Freq: Once | ORAL | Status: AC
  Administered 2024-02-12: 40 mg via ORAL
  Filled 2024-02-12: qty 2

## 2024-02-12 MED ORDER — LEVOTHYROXINE SODIUM 88 MCG PO TABS
88.0000 ug | ORAL_TABLET | Freq: Every day | ORAL | Status: DC
  Administered 2024-02-13 – 2024-02-16 (×4): 88 ug via ORAL
  Filled 2024-02-12 (×4): qty 1

## 2024-02-12 MED ORDER — NYSTATIN 100000 UNIT/GM EX POWD
Freq: Three times a day (TID) | CUTANEOUS | Status: DC
  Filled 2024-02-12: qty 15

## 2024-02-12 MED ORDER — DOCUSATE SODIUM 100 MG PO CAPS
100.0000 mg | ORAL_CAPSULE | Freq: Two times a day (BID) | ORAL | Status: DC
  Administered 2024-02-12 – 2024-02-16 (×8): 100 mg via ORAL
  Filled 2024-02-12 (×9): qty 1

## 2024-02-12 MED ORDER — ACETAMINOPHEN 650 MG RE SUPP: 650.0000 mg | Freq: Four times a day (QID) | RECTAL | Status: DC | PRN

## 2024-02-12 NOTE — Assessment & Plan Note (Signed)
-   Check TSH continue home medications Synthroid at 88mcg po q day  

## 2024-02-12 NOTE — ED Triage Notes (Signed)
 Pt bib EMS from home. Pt c/o lower sciatic back pain shooting down left hip for around a week. Pt states she helped husband off ground and has been hurting ever since. Pt is holding up left leg with scarf tied around upper thigh to help support and reduce pain. Pt states she did not have any recent falls. 160/80 BP 86 P 93 RA

## 2024-02-12 NOTE — Assessment & Plan Note (Signed)
 Patient found to have fever 100.7 in the emergency department.  She does endorse burning with urination Has a decubitus ulcer Noted to have elevated white blood cell count but patient has been recently on steroids.  Await results of urine culture blood cultures chest x-ray ordered Procalcitonin Respiratory panel ordered Treat as needed at this point patient is hemodynamically stable

## 2024-02-12 NOTE — Assessment & Plan Note (Signed)
-  wound care consult 

## 2024-02-12 NOTE — Assessment & Plan Note (Addendum)
 Patient currently denies history of diabetes diet controlled monitor May have elevated blood sugars in the setting of steroid use Order sliding scale

## 2024-02-12 NOTE — Progress Notes (Signed)
 Patient ID: AYDEE MCNEW, female   DOB: February 19, 1935, 89 y.o.   MRN: [id number] Dr. Randol from the emergency room did contact orthopedics about this patient.  I did review her notes from when she saw Dr. Harden, my partner, recently in the office.  Also looked at the MRI of her left hip as well as the MRI of her lumbar spine.  From the standpoint of her left hip, she does have arthritis of the hip and a joint effusion but no fracture.  There is significant edema in muscles around the hip showing obviously some type of injury to the muscles around the left hip but nothing that would lead to an acute surgical intervention.  From a hip standpoint, the recommendations would be PT and OT with weightbearing as tolerated.  There is potentially some component of her pain that is radicular in nature and the lumbar spine MRI does show a herniated disc at L4-L5 to the left side that certainly could be irritating nerves leading to pain and some of the radicular symptoms that she is experiencing.  We will make sure that we see her in consultation tomorrow.  As for now, again, it is okay to attempt to mobilize the patient with weightbearing as tolerated on her left lower extremity and medications for pain control to address the pain and inflammation.

## 2024-02-12 NOTE — Assessment & Plan Note (Signed)
Chronic stable continue Pravachol 20 mg a day

## 2024-02-12 NOTE — Assessment & Plan Note (Signed)
 MRI worrisome for muscular strain no evidence of tear Continue pain management ambulate is able to tolerate PT OT consult Appreciate orthopedics consult Robaxin  for muscle spasms

## 2024-02-12 NOTE — H&P (Signed)
 Desta Bujak Delaughter FMW:[id number] DOB: Jan 14, 1935 DOA: 02/12/2024     PCP: Lucius Krabbe, NP   Outpatient Specialists:  orthopedics Dr.Duda Patient arrived to ER on 02/12/24 at 1259 Referred by Attending Randol Simmonds, MD   Patient coming from:    home Lives  With family     Chief Complaint:   Chief Complaint  Patient presents with   Hip Pain    HPI: ONEISHA AMMONS is a 89 y.o. female with medical history significant of DM2, HTN, CKD stage 2  hypothyroidism, HLD, Osteoporosis    Presented with left hip pain  On Thursday she was helping husband to get up from the ground and pulled her left hip. She has not been able to ambulate secondary to sever groin pain. Not on [facility]  No falls Denies fever or chills at home but febrile in ER up 100.7 Had a bit of dysuria and strong smell of urine  No N/v/D  No CP no SOB, no cough , no sick contacts        Was seen by Dr. Harden on 11/13 he prescribed prednisone  10 mg fell may be due to sciatica  Denies significant ETOH intake   Does not smoke      Regarding pertinent Chronic problems:    Hyperlipidemia -  on statins  Pravachol       HTN on toprol        DM 2 - , diet controlled   Hypothyroidism:   on synthroid          While in ER: Clinical Course as of 02/12/24 1909  Sat Feb 12, 2024  1417 CBC(!) White blood cell count elevated [JK]  1418 Plain films without acute abnormality pelvic CT or MRI recommended if there is clinical concern of pelvic fracture [JK]  1618 X-ray shows anterolisthesis L5-S1 with moderate to severe bilateral foraminal narrowing.  She does have left word disc retrusion at L4-L5 causing left foraminal stenosis [JK]  1751 Case discussed with Neurosurgery, [place].  Sx could be radicular in nature.  Patient can follow-up as outpatient as needed  MRI of hip also shows muscle strain that could also account for her symptoms [JK]  1816 Case discussed with Dr Vernetta.  Reviewed findings.  Will see  patient in consultation in the morning.  Patient can weight-bear as tolerated [JK]  1847 Case discussed with Dr. Silvester regarding admission [JK]    Clinical Course User Index [JK] Randol Simmonds, MD       Lab Orders         Resp panel by RT-PCR (RSV, Flu A&B, Covid) Anterior Nasal Swab         Blood culture (routine x 2)         CBC         Basic metabolic panel         Sedimentation rate         Urinalysis, w/ Reflex to Culture (Infection Suspected) -Urine, Clean Catch     Left knee - Status post knee arthroplasty without evidence of hardware complication.   Pelvis -  If there is a persistent clinical concern for nondisplaced hip or pelvic fracture, recommend dedicated pelvic CT or MRI.  MRI - Grade 3 anterolisthesis at L5-S1 with moderate to severe bilateral foraminal narrowing. 2. Broad-based disc protrusion and advanced facet hypertrophy at L3-4 resulting in moderate central and right foraminal stenosis, with mild left foraminal narrowing. 3. Leftward disc protrusion at L4-5 resulting in moderate left foraminal  stenosis.  MRI  - Marked intramuscular edema within the medial left hip throughout the obturator and abductor musculature, consistent with severe muscle strain. No evidence of muscle tear. Moderate left hip effusion.     CXR - ordered    Following Medications were ordered in ER: Medications  fentaNYL  (SUBLIMAZE ) injection 50 mcg (50 mcg Intravenous Given 02/12/24 1336)  ondansetron  (ZOFRAN ) injection 4 mg (4 mg Intravenous Given 02/12/24 1335)  fentaNYL  (SUBLIMAZE ) injection 50 mcg (50 mcg Intravenous Given 02/12/24 1504)  predniSONE  (DELTASONE ) tablet 40 mg (40 mg Oral Given 02/12/24 1815)    _______________________________________________________ ER Provider Called:     Orhtopedics  Dr. Vernetta, Lonni GRADE, MD  They Recommend admit to medicine    SEEN in ER     ED Triage Vitals  Encounter Vitals Group     BP 02/12/24 1311 (!) 162/141     Girls  Systolic BP Percentile --      Girls Diastolic BP Percentile --      Boys Systolic BP Percentile --      Boys Diastolic BP Percentile --      Pulse Rate 02/12/24 1311 100     Resp 02/12/24 1311 16     Temp 02/12/24 1313 98.7 F (37.1 C)     Temp Source 02/12/24 1313 Oral     SpO2 02/12/24 1311 96 %     Weight 02/12/24 1313 146 lb (66.2 kg)     Height 02/12/24 1313 5' 1 (1.549 m)     Head Circumference --      Peak Flow --      Pain Score 02/12/24 1313 10     Pain Loc --      Pain Education --      Exclude from Growth Chart --   UFJK(75)@     _________________________________________ Significant initial  Findings: Abnormal Labs Reviewed  CBC - Abnormal; Notable for the following components:      Result Value   WBC 12.2 (*)    Hemoglobin 15.2 (*)    All other components within normal limits  SEDIMENTATION RATE - Abnormal; Notable for the following components:   Sed Rate 68 (*)    All other components within normal limits     ECG: Ordered   BNP (last 3 results) No results for input(s): BNP in the last 8760 hours.    No results for input(s): DDIMER, FERRITIN, LDH, CRP in the last 72 hours.      The recent clinical data is shown below. Vitals:   02/12/24 1500 02/12/24 1515 02/12/24 1813 02/12/24 1817  BP: (!) 145/123 (!) 141/47  (!) 156/56  Pulse: 84 80  86  Resp:  16  16  Temp:   (!) 100.7 F (38.2 C)   TempSrc:   Oral   SpO2: 94% 95%  95%  Weight:      Height:        WBC     Component Value Date/Time   WBC 12.2 (H) 02/12/2024 1338    Procalcitonin   Ordered      UA  ordered     Results for orders placed or performed during the hospital encounter of 07/26/14  Surgical pcr screen     Status: Abnormal   Collection Time: 07/26/14 11:08 AM   Specimen: Nasal Mucosa; Nasal Swab  Result Value Ref Range Status   MRSA, PCR NEGATIVE NEGATIVE Final   Staphylococcus aureus POSITIVE (A) NEGATIVE Final    Comment:  The Xpert SA Assay  (FDA approved for NASAL specimens in patients over 15 years of age), is one component of a comprehensive surveillance program.  Test performance has been validated by [facility] for patients greater than or equal to 64 year old. It is not intended to diagnose infection nor to guide or monitor treatment.     ABX started Antibiotics Given (last 72 hours)     None        No results found for the last 90 days.   ____________________________________________ Recent Labs  Lab 02/12/24 1338  NA 136  K 3.6  CO2 25  GLUCOSE 94  BUN 21  CREATININE 0.74  CALCIUM 10.0    Cr  stable,    Lab Results  Component Value Date   CREATININE 0.74 02/12/2024   CREATININE 0.80 07/26/2014    No results for input(s): AST, ALT, ALKPHOS, BILITOT, PROT, ALBUMIN in the last 168 hours. Lab Results  Component Value Date   CALCIUM 10.0 02/12/2024    Plt: Lab Results  Component Value Date   PLT 356 02/12/2024       Recent Labs  Lab 02/12/24 1338  WBC 12.2*  HGB 15.2*  HCT 45.7  MCV 89.4  PLT 356    HG/HCT   stable,      Component Value Date/Time   HGB 15.2 (H) 02/12/2024 1338   HCT 45.7 02/12/2024 1338   MCV 89.4 02/12/2024 1338    _______________________________________________ Hospitalist was called for admission for   Muscle strain    Sciatica of left side  Fever,     The following Work up has been ordered so far:  Orders Placed This Encounter  Procedures   Resp panel by RT-PCR (RSV, Flu A&B, Covid) Anterior Nasal Swab   Blood culture (routine x 2)   DG Knee Complete 4 Views Left   DG Hip Unilat W or Wo Pelvis 2-3 Views Left   MR LUMBAR SPINE WO CONTRAST   MR HIP LEFT WO CONTRAST   DG Chest 2 View   CBC   Basic metabolic panel   Sedimentation rate   Urinalysis, w/ Reflex to Culture (Infection Suspected) -Urine, Clean Catch   Ambulate patient   Consult to neurosurgery   Consult to orthopedic surgery   Consult to hospitalist   Saline lock IV      OTHER Significant initial  Findings:  labs showing:     DM  labs:  HbA1C: No results for input(s): HGBA1C in the last 8760 hours.     CBG (last 3)  No results for input(s): GLUCAP in the last 72 hours.        Cultures: No results found for: SDES, SPECREQUEST, CULT, REPTSTATUS   Radiological Exams on Admission: MR HIP LEFT WO CONTRAST Result Date: 02/12/2024 CLINICAL DATA:  Lower back pain radiating to left hip for 1 week, negative x-ray EXAM: MR OF THE LEFT HIP WITHOUT CONTRAST TECHNIQUE: Multiplanar, multisequence MR imaging was performed. No intravenous contrast was administered. COMPARISON:  02/12/2024 FINDINGS: Bones: There are no acute displaced fractures. There are no destructive bony abnormalities. Within the left iliac bone adjacent to the SI joint there is a 2.2 x 4.4 x 2.9 cm region demonstrating heterogeneous increased T2 signal, likely benign etiology such as enchondroma. Articular cartilage and labrum Articular cartilage: There is moderate symmetrical bilateral hip joint space narrowing and osteophyte formation compatible with osteoarthritis. Labrum:  No gross abnormality. Joint or bursal effusion Joint effusion:  Moderate left hip effusion.  No right hip effusion. Bursae: Unremarkable. Muscles and tendons Muscles and tendons: There is abnormal increased T2 signal within the abductor musculature of the medial left hip. Intramuscular edema is seen within the operator internus, obturator externus, and abductor longus and brevis muscles. Findings are compatible with strain. No evidence of muscle tear. Other findings Miscellaneous: Uterus is atrophic, with cystic changes in the endometrium measuring up to 12 mm. Further evaluation with nonemergent outpatient pelvic ultrasound may prove useful. IMPRESSION: 1. No acute displaced fracture. 2. Marked intramuscular edema within the medial left hip throughout the obturator and abductor musculature, consistent with severe  muscle strain. No evidence of muscle tear. 3. Moderate left hip effusion. 4. Moderate symmetrical bilateral hip osteoarthritis. Electronically Signed   By: Ozell Daring M.D.   On: 02/12/2024 17:44   MR LUMBAR SPINE WO CONTRAST Result Date: 02/12/2024 EXAM: MRI LUMBAR SPINE 02/12/2024 04:00:43 PM TECHNIQUE: Multiplanar multisequence MRI of the lumbar spine was performed without the administration of intravenous contrast. COMPARISON: Lumbar spine radiographs 02/03/2024. CLINICAL HISTORY: Myelopathy, acute, lumbar spine. Severe low back pain extending into the left lower extremity. FINDINGS: BONES AND ALIGNMENT: Normal vertebral body heights. Bone marrow signal is unremarkable except for edematous endplate marrow changes present posteriorly at L4-L5. L5 pars defects are present bilaterally. Grade 3 anterolisthesis measures 15 mm at L5-S1. Slight retrolisthesis is present at L4-L5. SPINAL CORD: The conus terminates normally. SOFT TISSUES: No paraspinal mass. L1-L2: Mild disc bulging is present at L1-L2 without significant stenosis. L2-L3: A broad-based disc protrusion and moderate facet hypertrophy are present bilaterally at L2-L3. No significant stenosis is present. L3-L4: A broad-based disc protrusion and advanced facet hypertrophy at L3-L4 result in moderate central and right foraminal stenosis. Left foraminal narrowing is mild. L4-L5: A leftward disc protrusion at L4-L5 results in moderate left foraminal stenosis. L5-S1: The anterolisthesis at L5-S1 results in moderate to severe foraminal narrowing bilaterally. LEFT KIDNEY: A simple cyst at the upper pole of the left kidney measures 4.3 cm maximally. IMPRESSION: 1. Grade 3 anterolisthesis at L5-S1 with moderate to severe bilateral foraminal narrowing. 2. Broad-based disc protrusion and advanced facet hypertrophy at L3-4 resulting in moderate central and right foraminal stenosis, with mild left foraminal narrowing. 3. Leftward disc protrusion at L4-5 resulting  in moderate left foraminal stenosis. Electronically signed by: Lonni Necessary MD 02/12/2024 04:16 PM EST RP Workstation: [id number]   DG Hip Unilat W or Wo Pelvis 2-3 Views Left Result Date: 02/12/2024 CLINICAL DATA:  pain EXAM: DG HIP (WITH OR WITHOUT PELVIS) 2-3V LEFT COMPARISON:  December 23, 2017 FINDINGS: Osteopenia. No acute fracture or dislocation. Joint space narrowing and osteophyte formation of bilateral hips, favored progressed since 2019. Sacrum is obscured by overlapping bowel contents. No area of erosion or osseous destruction. No unexpected radiopaque foreign body. Pelvic phleboliths. IMPRESSION: 1. No acute fracture or dislocation. 2. If there is a persistent clinical concern for nondisplaced hip or pelvic fracture, recommend dedicated pelvic CT or MRI. Electronically Signed   By: Corean Salter M.D.   On: 02/12/2024 14:09   DG Knee Complete 4 Views Left Result Date: 02/12/2024 CLINICAL DATA:  pain EXAM: LEFT KNEE - COMPLETE 4+ VIEW COMPARISON:  February 03, 2024 FINDINGS: Status post knee arthroplasty. Orthopedic hardware is intact and without periprosthetic fracture or lucency. Osteopenia. No acute fracture or dislocation. Soft tissues are unremarkable. IMPRESSION: Status post knee arthroplasty without evidence of hardware complication. Electronically Signed   By: Corean Salter M.D.   On: 02/12/2024 14:07   _______________________________________________________________________________________________________ Latest  Blood pressure (!) 156/56, pulse 86, temperature (!) 100.7 F (38.2 C), temperature source Oral, resp. rate 16, height 5' 1 (1.549 m), weight 66.2 kg, SpO2 95%.   Vitals  labs and radiology finding personally reviewed  Review of Systems:    Pertinent positives include:  fatigue, left hip pain   Constitutional:  No weight loss, night sweats, Fevers, chills,  weight loss  HEENT:  No headaches, Difficulty swallowing,Tooth/dental problems,Sore throat,   No sneezing, itching, ear ache, nasal congestion, post nasal drip,  Cardio-vascular:  No chest pain, Orthopnea, PND, anasarca, dizziness, palpitations.no Bilateral lower extremity swelling  GI:  No heartburn, indigestion, abdominal pain, nausea, vomiting, diarrhea, change in bowel habits, loss of appetite, melena, blood in stool, hematemesis Resp:  no shortness of breath at rest. No dyspnea on exertion, No excess mucus, no productive cough, No non-productive cough, No coughing up of blood.No change in color of mucus.No wheezing. Skin:  no rash or lesions. No jaundice GU:  no dysuria, change in color of urine, no urgency or frequency. No straining to urinate.  No flank pain.  Musculoskeletal:  No joint pain or no joint swelling. No decreased range of motion. No back pain.  Psych:  No change in mood or affect. No depression or anxiety. No memory loss.  Neuro: no localizing neurological complaints, no tingling, no weakness, no double vision, no gait abnormality, no slurred speech, no confusion  All systems reviewed and apart from HOPI all are negative _______________________________________________________________________________________________ Past Medical History:   Past Medical History:  Diagnosis Date   Arthritis    Cancer (HCC)    40 YRS AGO MALIGNANT MELANOMA LEG+ LT EAR   Cerebral aneurysm    X2   PROBABLY WAS BORN WITH THEM, THEY ARE TINY   Cystitis    GERD (gastroesophageal reflux disease)    Hypertension    Hypothyroidism       Past Surgical History:  Procedure Laterality Date   CHOLECYSTECTOMY     SKIN MELANOMAS REMOVED     TOTAL KNEE ARTHROPLASTY Left 08/01/2014   Procedure: TOTAL KNEE ARTHROPLASTY;  Surgeon: Jerona Harden GAILS, MD;  Location: [facility] OR;  Service: Orthopedics;  Laterality: Left;   WISDOM TOOTH EXTRACTION     1 WEEK AGO REMOVED    Social History:  Ambulatory   independently      reports that she has quit smoking. She has never used smokeless  tobacco. She reports that she does not currently use alcohol. She reports that she does not use drugs.   Family History:   Family History  Problem Relation Age of Onset   Ovarian cancer Mother    Alcohol abuse Father    Stroke Sister    ______________________________________________________________________________________________ Allergies: Allergies  Allergen Reactions   Codeine Anaphylaxis and Swelling   Latex     RASH    Sulfa Antibiotics Anaphylaxis and Swelling     Prior to Admission medications   Medication Sig Start Date End Date Taking? Authorizing Provider  HYDROcodone -acetaminophen  (NORCO/VICODIN) 5-325 MG tablet Take 1 tablet by mouth every 6 (six) hours as needed. 02/07/24   Harden Jerona GAILS, MD  acetaminophen  (TYLENOL ) 500 MG tablet Take 1,000 mg by mouth 2 (two) times daily as needed (pain).    [provider]  ARTIFICIAL TEAR SOLUTION OP Place 1 drop into the right eye daily as needed (dryness).    [provider]  Bismuth Subsalicylate (PEPTO BISMOL PO) Take by mouth as needed.    [provider]  clotrimazole  (LOTRIMIN ) 1 % cream Apply 1 Application topically 2 (two) times daily. Apply to vaginal area for itching. 01/18/24   Lucius Krabbe, NP  glucosamine-chondroitin 500-400 MG tablet Take 1 tablet by mouth 3 (three) times daily.    [provider]  levothyroxine  (SYNTHROID , LEVOTHROID) 88 MCG tablet Take 1 tablet by mouth daily before breakfast.  04/16/14   [provider]  metoprolol  succinate (TOPROL -XL) 25 MG 24 hr tablet Take 12.5 mg by mouth daily. 06/13/14   [provider]  omeprazole (PRILOSEC) 20 MG capsule Take 20 mg by mouth daily. 05/24/14   [provider]  oxyCODONE -acetaminophen  (PERCOCET/ROXICET) 5-325 MG tablet Take 1 tablet by mouth every 6 (six) hours as needed for severe pain (pain score 7-10). 02/09/24   Valdemar Rocky SAUNDERS, NP  pravastatin  (PRAVACHOL ) 20 MG tablet Take 20 mg by mouth  daily. 06/25/14   [provider]  predniSONE  (DELTASONE ) 10 MG tablet Take 1 tablet (10 mg total) by mouth daily with breakfast. 02/03/24   Harden Jerona GAILS, MD    ___________________________________________________________________________________________________ Physical Exam:    02/12/2024    6:17 PM 02/12/2024    3:15 PM 02/12/2024    3:00 PM  Vitals with BMI  Systolic 156 141 854  Diastolic 56 47 123  Pulse 86 80 84     1. General:  in No  Acute distress    Chronically ill   -appearing 2. Psychological: Alert and   Oriented 3. Head/ENT:  Dry Mucous Membranes                          Head Non traumatic, neck supple                         Poor Dentition 4. SKIN: decreased Skin turgor,  Skin clean Dry stage II decubitus ulcer on his sacrum    5. Heart: Regular rate and rhythm no  Murmur, no Rub or gallop 6. Lungs:   no wheezes or crackles   7. Abdomen: Soft,  non-tender, Non distended sounds present 8. Lower extremities: no clubbing, cyanosis, no  edema 9. Neurologically Grossly intact, moving all 4 extremities equally   10. MSK: Normal range of motion    Chart has been reviewed  ______________________________________________________________________________________________  Assessment/Plan  89 y.o. female with medical history significant of DM2, HTN, CKD stage 2  hypothyroidism, HLD, Osteoporosis   Admitted for   Muscle strain  Sciatica of left side  Fever,     Present on Admission:  Left hip pain  Acquired hypothyroidism  Fever  Other hyperlipidemia  Stage II decubitus ulcer (HCC)  Essential hypertension  Sciatica of left side     Acquired hypothyroidism - Check TSH continue home medications Synthroid  at 88 mcg po q day   Left hip pain MRI worrisome for muscular strain no evidence of tear Continue pain management ambulate is able to tolerate PT OT consult Appreciate orthopedics consult Robaxin  for muscle spasms  Fever Patient found to have  fever 100.7 in the emergency department.  She does endorse burning with urination Has a decubitus ulcer Noted to have elevated white blood cell count but patient has been recently on steroids.  Await results of urine culture blood cultures chest x-ray ordered Procalcitonin Respiratory panel ordered Treat as needed at this point patient is hemodynamically stable  Other hyperlipidemia Chronic stable continue Pravachol  20 mg a day  Stage II decubitus ulcer (HCC)  wound care consult  Controlled type 2 diabetes mellitus without complication, without long-term current use of insulin  (HCC) Patient currently denies history of diabetes diet controlled monitor May have elevated blood sugars in the setting of steroid use Order sliding scale  Essential hypertension Continue Toprol   Sciatica of left side Appreciate orthopedics consult patient got a dose of prednisone  given history of diabetes and possible underlying infection will continue to monitor response to see if able to continue    Other plan as per orders.  DVT prophylaxis:  SCD      Code Status:    DNR/DNI  as per patient   I had personally discussed CODE STATUS with patient and family   ACP   none   Family Communication:   Family  at  Bedside  plan of care was discussed with   Son, Daughter in social worker,    Diet  hearthealthy diabetic   Disposition Plan:    To home once workup is complete and patient is stable   Following barriers for discharge:                                                         Pain controlled with PO medications                                                            Will need consultants to evaluate patient prior to discharge                                Consult Orders  (From admission, onward)           Start     Ordered   02/12/24 1813  Consult to hospitalist  Once       Provider:  (Not yet assigned)  Question Answer Comment  Place call to: [facility]   Reason for Consult  Admit      02/12/24 1812                               Would benefit from PT/OT eval prior to DC  Ordered                                    Consults called:   Treatment Team:  Vernetta Lonni GRADE, MD  Admission status:  ED Disposition     ED Disposition  Admit   Condition  --   Comment  Hospital Area: [facility] [[id number]]  Level of Care: Telemetry [5]  Admit to tele based on following criteria: Other see comments  Comments: sirs  May admit patient to [facility] or [facility] if equivalent level of care is available:: No  Diagnosis: Left hip pain [[id number]]  Admitting Physician: Psalm Arman [[id number]]  Attending Physician: Princesa Willig [[id number]]  Certification:: I certify this patient will need inpatient services for at least 2 midnights  inpatient     I Expect 2 midnight stay secondary to severity of patient's current illness need for inpatient interventions justified by the following:    Severe lab/radiological/exam abnormalities including:    Muscle strain  Sciatica of left side  Fever, unspecified fever cause    and extensive comorbidities including:  DM2   That are currently affecting medical management.   I expect  patient to be hospitalized for 2 midnights requiring inpatient medical care.  Patient is at high risk for adverse outcome (such as loss of life or disability) if not treated.  Indication for inpatient stay as follows:  ,  severe pain requiring acute inpatient management,   Need for  , IV fluids,, IV pain medications     Level of care     tele  For 12H    Liboria Putnam 02/12/2024, 8:16 PM     [facility]     after 2 AM please page floor coverage   If 7AM-7PM, please contact the day team taking care of the patient using [email or web address]

## 2024-02-12 NOTE — Assessment & Plan Note (Signed)
 -  Continue Toprol

## 2024-02-12 NOTE — ED Provider Notes (Signed)
 [facility] AT [facility] Provider Note   CSN: [id number] Arrival date & time: 02/12/24  1259     Patient presents with: Hip Pain   Rachel Pierce is a 89 y.o. female.  {Add pertinent medical, surgical, social history, OB history to HPI:32947}  Hip Pain     Patient has a history of hypothyroidism, hypertension, cerebral aneurysm, acid reflux, arthritis, melanoma.  Patient states she has been having trouble with pain in her knee ongoing for a couple weeks now.  Patient states it started after she tried to help her husband and off the ground couple weeks ago.  Patient states she has not fallen.  She has not had any fevers or chills.  Patient states she is not actually having pain in her back although when I initially asked her what brought her into the emergency room today she said she thought it was sciatica.  Patient initially denied having pain in the groin area but then indicated she was having some pain in that area.  Patient states she saw Dr. Harden on the 17th.  She had x-rays of her knee and spine.  Patient was to have degenerative changes in her spine, she had stable postoperative changes in her knee Pt states she has not been able to bathe this past week.  Prior to Admission medications   Medication Sig Start Date End Date Taking? Authorizing Provider  HYDROcodone -acetaminophen  (NORCO/VICODIN) 5-325 MG tablet Take 1 tablet by mouth every 6 (six) hours as needed. 02/07/24   Harden Jerona GAILS, MD  acetaminophen  (TYLENOL ) 500 MG tablet Take 1,000 mg by mouth 2 (two) times daily as needed (pain).    [provider]  ARTIFICIAL TEAR SOLUTION OP Place 1 drop into the right eye daily as needed (dryness).    [provider]  Bismuth Subsalicylate (PEPTO BISMOL PO) Take by mouth as needed.    [provider]  clotrimazole  (LOTRIMIN ) 1 % cream Apply 1 Application topically 2 (two) times daily. Apply to vaginal area for itching.  01/18/24   Lucius Krabbe, NP  glucosamine-chondroitin 500-400 MG tablet Take 1 tablet by mouth 3 (three) times daily.    [provider]  levothyroxine  (SYNTHROID , LEVOTHROID) 88 MCG tablet Take 1 tablet by mouth daily before breakfast.  04/16/14   [provider]  metoprolol  succinate (TOPROL -XL) 25 MG 24 hr tablet Take 12.5 mg by mouth daily. 06/13/14   [provider]  omeprazole (PRILOSEC) 20 MG capsule Take 20 mg by mouth daily. 05/24/14   [provider]  oxyCODONE -acetaminophen  (PERCOCET/ROXICET) 5-325 MG tablet Take 1 tablet by mouth every 6 (six) hours as needed for severe pain (pain score 7-10). 02/09/24   Valdemar Rocky SAUNDERS, NP  pravastatin  (PRAVACHOL ) 20 MG tablet Take 20 mg by mouth daily. 06/25/14   [provider]  predniSONE  (DELTASONE ) 10 MG tablet Take 1 tablet (10 mg total) by mouth daily with breakfast. 02/03/24   Harden Jerona GAILS, MD    Allergies: Codeine, Latex, and Sulfa antibiotics    Review of Systems  Updated Vital Signs BP (!) 156/56 (BP Location: Right Arm)   Pulse 86   Temp (!) 100.7 F (38.2 C) (Oral)   Resp 16   Ht 1.549 m (5' 1)   Wt 66.2 kg   SpO2 95%   BMI 27.59 kg/m   Physical Exam Vitals and nursing note reviewed.  Constitutional:      General: She is not in acute distress.  Appearance: She is well-developed.  HENT:     Head: Normocephalic and atraumatic.     Right Ear: External ear normal.     Left Ear: External ear normal.  Eyes:     General: No scleral icterus.       Right eye: No discharge.        Left eye: No discharge.     Conjunctiva/sclera: Conjunctivae normal.  Neck:     Trachea: No tracheal deviation.  Cardiovascular:     Rate and Rhythm: Normal rate.  Pulmonary:     Effort: Pulmonary effort is normal. No respiratory distress.     Breath sounds: No stridor.  Abdominal:     General: There is no distension.     Palpations: There is no mass.     Tenderness: There is no abdominal  tenderness.  Musculoskeletal:        General: No swelling or deformity.     Cervical back: Neck supple.     Lumbar back: No tenderness.     Left knee: No swelling, deformity or effusion. Decreased range of motion. Tenderness present.     Comments: Pain with range of motion, ttp left groin area, no mass no hernia  Skin:    General: Skin is warm and dry.     Findings: No rash.  Neurological:     Mental Status: She is alert. Mental status is at baseline.     Cranial Nerves: No dysarthria or facial asymmetry.     Motor: No seizure activity.     (all labs ordered are listed, but only abnormal results are displayed) Labs Reviewed  CBC - Abnormal; Notable for the following components:      Result Value   WBC 12.2 (*)    Hemoglobin 15.2 (*)    All other components within normal limits  SEDIMENTATION RATE - Abnormal; Notable for the following components:   Sed Rate 68 (*)    All other components within normal limits  BASIC METABOLIC PANEL WITH GFR  URINALYSIS, W/ REFLEX TO CULTURE (INFECTION SUSPECTED)    EKG: None  Radiology: MR HIP LEFT WO CONTRAST Result Date: 02/12/2024 CLINICAL DATA:  Lower back pain radiating to left hip for 1 week, negative x-ray EXAM: MR OF THE LEFT HIP WITHOUT CONTRAST TECHNIQUE: Multiplanar, multisequence MR imaging was performed. No intravenous contrast was administered. COMPARISON:  02/12/2024 FINDINGS: Bones: There are no acute displaced fractures. There are no destructive bony abnormalities. Within the left iliac bone adjacent to the SI joint there is a 2.2 x 4.4 x 2.9 cm region demonstrating heterogeneous increased T2 signal, likely benign etiology such as enchondroma. Articular cartilage and labrum Articular cartilage: There is moderate symmetrical bilateral hip joint space narrowing and osteophyte formation compatible with osteoarthritis. Labrum:  No gross abnormality. Joint or bursal effusion Joint effusion:  Moderate left hip effusion.  No right hip  effusion. Bursae: Unremarkable. Muscles and tendons Muscles and tendons: There is abnormal increased T2 signal within the abductor musculature of the medial left hip. Intramuscular edema is seen within the operator internus, obturator externus, and abductor longus and brevis muscles. Findings are compatible with strain. No evidence of muscle tear. Other findings Miscellaneous: Uterus is atrophic, with cystic changes in the endometrium measuring up to 12 mm. Further evaluation with nonemergent outpatient pelvic ultrasound may prove useful. IMPRESSION: 1. No acute displaced fracture. 2. Marked intramuscular edema within the medial left hip throughout the obturator and abductor musculature, consistent with severe muscle strain. No evidence of  muscle tear. 3. Moderate left hip effusion. 4. Moderate symmetrical bilateral hip osteoarthritis. Electronically Signed   By: Ozell Daring M.D.   On: 02/12/2024 17:44   MR LUMBAR SPINE WO CONTRAST Result Date: 02/12/2024 EXAM: MRI LUMBAR SPINE 02/12/2024 04:00:43 PM TECHNIQUE: Multiplanar multisequence MRI of the lumbar spine was performed without the administration of intravenous contrast. COMPARISON: Lumbar spine radiographs 02/03/2024. CLINICAL HISTORY: Myelopathy, acute, lumbar spine. Severe low back pain extending into the left lower extremity. FINDINGS: BONES AND ALIGNMENT: Normal vertebral body heights. Bone marrow signal is unremarkable except for edematous endplate marrow changes present posteriorly at L4-L5. L5 pars defects are present bilaterally. Grade 3 anterolisthesis measures 15 mm at L5-S1. Slight retrolisthesis is present at L4-L5. SPINAL CORD: The conus terminates normally. SOFT TISSUES: No paraspinal mass. L1-L2: Mild disc bulging is present at L1-L2 without significant stenosis. L2-L3: A broad-based disc protrusion and moderate facet hypertrophy are present bilaterally at L2-L3. No significant stenosis is present. L3-L4: A broad-based disc protrusion and  advanced facet hypertrophy at L3-L4 result in moderate central and right foraminal stenosis. Left foraminal narrowing is mild. L4-L5: A leftward disc protrusion at L4-L5 results in moderate left foraminal stenosis. L5-S1: The anterolisthesis at L5-S1 results in moderate to severe foraminal narrowing bilaterally. LEFT KIDNEY: A simple cyst at the upper pole of the left kidney measures 4.3 cm maximally. IMPRESSION: 1. Grade 3 anterolisthesis at L5-S1 with moderate to severe bilateral foraminal narrowing. 2. Broad-based disc protrusion and advanced facet hypertrophy at L3-4 resulting in moderate central and right foraminal stenosis, with mild left foraminal narrowing. 3. Leftward disc protrusion at L4-5 resulting in moderate left foraminal stenosis. Electronically signed by: Lonni Necessary MD 02/12/2024 04:16 PM EST RP Workstation: [id number]   DG Hip Unilat W or Wo Pelvis 2-3 Views Left Result Date: 02/12/2024 CLINICAL DATA:  pain EXAM: DG HIP (WITH OR WITHOUT PELVIS) 2-3V LEFT COMPARISON:  December 23, 2017 FINDINGS: Osteopenia. No acute fracture or dislocation. Joint space narrowing and osteophyte formation of bilateral hips, favored progressed since 2019. Sacrum is obscured by overlapping bowel contents. No area of erosion or osseous destruction. No unexpected radiopaque foreign body. Pelvic phleboliths. IMPRESSION: 1. No acute fracture or dislocation. 2. If there is a persistent clinical concern for nondisplaced hip or pelvic fracture, recommend dedicated pelvic CT or MRI. Electronically Signed   By: Corean Salter M.D.   On: 02/12/2024 14:09   DG Knee Complete 4 Views Left Result Date: 02/12/2024 CLINICAL DATA:  pain EXAM: LEFT KNEE - COMPLETE 4+ VIEW COMPARISON:  February 03, 2024 FINDINGS: Status post knee arthroplasty. Orthopedic hardware is intact and without periprosthetic fracture or lucency. Osteopenia. No acute fracture or dislocation. Soft tissues are unremarkable. IMPRESSION: Status post  knee arthroplasty without evidence of hardware complication. Electronically Signed   By: Corean Salter M.D.   On: 02/12/2024 14:07    {Document cardiac monitor, telemetry assessment procedure when appropriate:32947} Procedures   Medications Ordered in the ED  fentaNYL  (SUBLIMAZE ) injection 50 mcg (50 mcg Intravenous Given 02/12/24 1336)  ondansetron  (ZOFRAN ) injection 4 mg (4 mg Intravenous Given 02/12/24 1335)  fentaNYL  (SUBLIMAZE ) injection 50 mcg (50 mcg Intravenous Given 02/12/24 1504)  predniSONE  (DELTASONE ) tablet 40 mg (40 mg Oral Given 02/12/24 1815)    Clinical Course as of 02/12/24 1831  Sat Feb 12, 2024  1417 CBC(!) White blood cell count elevated [JK]  1418 Plain films without acute abnormality pelvic CT or MRI recommended if there is clinical concern of pelvic fracture [JK]  1618  X-ray shows anterolisthesis L5-S1 with moderate to severe bilateral foraminal narrowing.  She does have left word disc retrusion at L4-L5 causing left foraminal stenosis [JK]  1751 Case discussed with Neurosurgery, [place].  Sx could be radicular in nature.  Patient can follow-up as outpatient as needed  MRI of hip also shows muscle strain that could also account for her symptoms [JK]  1816 Case discussed with Dr Vernetta.  Reviewed findings.  Will see patient in consultation in the morning.  Patient can weight-bear as tolerated [JK]    Clinical Course User Index [JK] Randol Simmonds, MD   {Click here for ABCD2, HEART and other calculators REFRESH Note before signing:1}                              Medical Decision Making Problems Addressed: Fever, unspecified fever cause: undiagnosed new problem with uncertain prognosis Muscle strain: acute illness or injury Sciatica of left side: acute illness or injury  Amount and/or Complexity of Data Reviewed Labs: ordered. Decision-making details documented in ED Course. Radiology: ordered and independent interpretation performed. Discussion of  management or test interpretation with external provider(s): Case discussed with orthopedic surgical service and neurosurgical service   Risk Prescription drug management.   Patient presented to the ED with complaints of leg pain.  Patient states initially her symptoms started after helping her husband up who had fallen.  Patient has followed up with orthopedics as an outpatient.  Her symptoms however have been progressing.  Patient did have slight leukocytosis.  However her exam does not suggest any signs of cellulitis.  Exam not suggestive of deep space infection  Patient did have MRIs of both her lumbar spine and left hip/.  There are findings in the spine that could account for radicular symptoms in her back.  The MRI of the hip also does show significant edema consistent with a muscle strain.  I have discussed case with neurosurgery and orthopedic surgery.  Patient will require admission to the hospital for pain management she is not able to bear weight.  She might ultimately need rehab.  Low-grade temp now noted in the ED.  Patient has had malodorous urine.  Will attempt to get a urine sample to check for UTI.  {Document critical care time when appropriate  Document review of labs and clinical decision tools ie CHADS2VASC2, etc  Document your independent review of radiology images and any outside records  Document your discussion with family members, caretakers and with consultants  Document social determinants of health affecting pt's care  Document your decision making why or why not admission, treatments were needed:32947:::1}   Final diagnoses:  Muscle strain  Sciatica of left side  Fever, unspecified fever cause    ED Discharge Orders     None

## 2024-02-12 NOTE — Assessment & Plan Note (Signed)
 Appreciate orthopedics consult patient got a dose of prednisone  given history of diabetes and possible underlying infection will continue to monitor response to see if able to continue

## 2024-02-12 NOTE — Subjective & Objective (Signed)
 On Thursday she was helping husband to get up from the ground and pulled her left hip. She has not been able to ambulate secondary to sever groin pain. Not on AC  No falls Denies fever or chills at home but febrile in ER up 100.7 Had a bit of dysuria and strong smell of urine  No N/v/D  No CP no SOB, no cough , no sick contacts

## 2024-02-13 ENCOUNTER — Inpatient Hospital Stay (HOSPITAL_COMMUNITY)

## 2024-02-13 DIAGNOSIS — M5432 Sciatica, left side: Secondary | ICD-10-CM | POA: Diagnosis not present

## 2024-02-13 DIAGNOSIS — M25552 Pain in left hip: Secondary | ICD-10-CM | POA: Diagnosis not present

## 2024-02-13 DIAGNOSIS — T148XXA Other injury of unspecified body region, initial encounter: Secondary | ICD-10-CM | POA: Diagnosis not present

## 2024-02-13 DIAGNOSIS — R509 Fever, unspecified: Secondary | ICD-10-CM | POA: Diagnosis not present

## 2024-02-13 DIAGNOSIS — N390 Urinary tract infection, site not specified: Secondary | ICD-10-CM | POA: Diagnosis present

## 2024-02-13 LAB — PHOSPHORUS
Phosphorus: 2.3 mg/dL — ABNORMAL LOW (ref 2.5–4.6)
Phosphorus: 4.4 mg/dL (ref 2.5–4.6)

## 2024-02-13 LAB — HEPATIC FUNCTION PANEL
ALT: 15 U/L (ref 0–44)
AST: 29 U/L (ref 15–41)
Albumin: 3.6 g/dL (ref 3.5–5.0)
Alkaline Phosphatase: 86 U/L (ref 38–126)
Bilirubin, Direct: 0.4 mg/dL — ABNORMAL HIGH (ref 0.0–0.2)
Indirect Bilirubin: 0.5 mg/dL (ref 0.3–0.9)
Total Bilirubin: 0.9 mg/dL (ref 0.0–1.2)
Total Protein: 7.3 g/dL (ref 6.5–8.1)

## 2024-02-13 LAB — COMPREHENSIVE METABOLIC PANEL WITH GFR
ALT: 15 U/L (ref 0–44)
AST: 25 U/L (ref 15–41)
Albumin: 3.4 g/dL — ABNORMAL LOW (ref 3.5–5.0)
Alkaline Phosphatase: 80 U/L (ref 38–126)
Anion gap: 10 (ref 5–15)
BUN: 18 mg/dL (ref 8–23)
CO2: 25 mmol/L (ref 22–32)
Calcium: 9.9 mg/dL (ref 8.9–10.3)
Chloride: 102 mmol/L (ref 98–111)
Creatinine, Ser: 0.8 mg/dL (ref 0.44–1.00)
GFR, Estimated: >60 mL/min (ref >60)
Glucose, Bld: 209 mg/dL — ABNORMAL HIGH (ref 70–99)
Potassium: 5.3 mmol/L — ABNORMAL HIGH (ref 3.5–5.1)
Sodium: 138 mmol/L (ref 135–145)
Total Bilirubin: 0.6 mg/dL (ref 0.0–1.2)
Total Protein: 7 g/dL (ref 6.5–8.1)

## 2024-02-13 LAB — URINALYSIS, W/ REFLEX TO CULTURE (INFECTION SUSPECTED)
Bilirubin Urine: NEGATIVE
Glucose, UA: NEGATIVE mg/dL
Ketones, ur: NEGATIVE mg/dL
Leukocytes,Ua: NEGATIVE
Nitrite: NEGATIVE
Protein, ur: 100 mg/dL — AB
RBC / HPF: >50 RBC/hpf (ref 0–5)
Specific Gravity, Urine: 1.018 (ref 1.005–1.030)
WBC, UA: >50 WBC/hpf (ref 0–5)
pH: 6 (ref 5.0–8.0)

## 2024-02-13 LAB — CBC
HCT: 43.4 % (ref 36.0–46.0)
Hemoglobin: 14.2 g/dL (ref 12.0–15.0)
MCH: 29.7 pg (ref 26.0–34.0)
MCHC: 32.7 g/dL (ref 30.0–36.0)
MCV: 90.8 fL (ref 80.0–100.0)
Platelets: 292 K/uL (ref 150–400)
RBC: 4.78 MIL/uL (ref 3.87–5.11)
RDW: 12.2 % (ref 11.5–15.5)
WBC: 9.1 K/uL (ref 4.0–10.5)
nRBC: 0 % (ref 0.0–0.2)

## 2024-02-13 LAB — GLUCOSE, CAPILLARY
Glucose-Capillary: 140 mg/dL — ABNORMAL HIGH (ref 70–99)
Glucose-Capillary: 204 mg/dL — ABNORMAL HIGH (ref 70–99)
Glucose-Capillary: 104 mg/dL — ABNORMAL HIGH (ref 70–99)
Glucose-Capillary: 109 mg/dL — ABNORMAL HIGH (ref 70–99)

## 2024-02-13 LAB — MAGNESIUM
Magnesium: 2.1 mg/dL (ref 1.7–2.4)
Magnesium: 2.3 mg/dL (ref 1.7–2.4)

## 2024-02-13 LAB — PREALBUMIN: Prealbumin: 14 mg/dL — ABNORMAL LOW (ref 18–38)

## 2024-02-13 LAB — PROCALCITONIN: Procalcitonin: 0.28 ng/mL

## 2024-02-13 LAB — HEMOGLOBIN A1C
Hgb A1c MFr Bld: 6.1 % — ABNORMAL HIGH (ref 4.8–5.6)
Mean Plasma Glucose: 128.37 mg/dL

## 2024-02-13 LAB — CK: Total CK: 199 U/L (ref 38–234)

## 2024-02-13 MED ORDER — POTASSIUM PHOSPHATES 15 MMOLE/5ML IV SOLN
15.0000 mmol | Freq: Once | INTRAVENOUS | Status: DC
  Administered 2024-02-13: 15 mmol via INTRAVENOUS
  Filled 2024-02-13: qty 5

## 2024-02-13 MED ORDER — COLLAGENASE 250 UNIT/GM EX OINT
TOPICAL_OINTMENT | Freq: Every day | CUTANEOUS | Status: DC
  Filled 2024-02-13: qty 30

## 2024-02-13 MED ORDER — SODIUM CHLORIDE 0.9 % IV SOLN
1.0000 g | INTRAVENOUS | Status: DC
  Administered 2024-02-13 – 2024-02-15 (×3): 1 g via INTRAVENOUS
  Filled 2024-02-13 (×3): qty 10

## 2024-02-13 MED ORDER — SODIUM ZIRCONIUM CYCLOSILICATE 10 G PO PACK
10.0000 g | PACK | Freq: Once | ORAL | Status: AC
  Administered 2024-02-13: 10 g via ORAL
  Filled 2024-02-13: qty 1

## 2024-02-13 MED ORDER — SODIUM CHLORIDE 0.9 % IV SOLN: INTRAVENOUS | Status: AC

## 2024-02-13 NOTE — Consult Note (Signed)
 Subjective:    Patient reports pain as moderate to severe pain left leg for over a week . Reports pain began after helping get husband up after a fall. Pain lateral left knee and left groin. Denies any radicular pain down the left leg.   Knee pain worse this morning. Radiology repeating x-rays this morning.   Objective: Vital signs in last 24 hours: Temp:  [97.8 F (36.6 C)-100.7 F (38.2 C)] 97.8 F (36.6 C) (11/23 0540) Pulse Rate:  [76-100] 76 (11/23 0540) Resp:  [16-18] 18 (11/23 0540) BP: (120-162)/(47-141) 130/65 (11/23 0540) SpO2:  [94 %-97 %] 94 % (11/23 0540) Weight:  [66.2 kg] 66.2 kg (11/22 1313)  Intake/Output from previous day: 11/22 0701 - 11/23 0700 In: 530.4 [I.V.:358.8; IV Piggyback:171.6] Out: 500 [Urine:500] Intake/Output this shift: No intake/output data recorded.  Recent Labs    02/12/24 1338 02/13/24 0346  HGB 15.2* 14.2   Recent Labs    02/12/24 1338 02/13/24 0346  WBC 12.2* 9.1  RBC 5.11 4.78  HCT 45.7 43.4  PLT 356 292   Recent Labs    02/12/24 1338 02/13/24 0346  NA 136 138  K 3.6 5.3*  CL 99 102  CO2 25 25  BUN 21 18  CREATININE 0.74 0.80  GLUCOSE 94 209*  CALCIUM 10.0 9.9   No results for input(s): LABPT, INR in the last 72 hours.  General : Awake and alert no acute distress. Lower extremities: Right hip full range of motion without pain. Left hip anterior and lateral pain with palpation. Pain with log roll and internal /external ration left hip. Left knee well healed surgical incision . No abnormal warmth erythema or effusion. No instability with valgus / varus stressing. Left knee good range of motion without pain . (Apprehension  with ROM left knee).  Right knee full range of motion without pain . Straight leg raise negative bilaterally. Calves supple non tender. Dorsi/plantar flexion intact bilateral ankles.   Radiographs: Left knee : 2 views 02/13/24 and multiple views 02/12/24 show no acute findings. Status post left total  knee arthroplasty without hardware failure or acute fracture. Knee well located.  Ap pelvis and left lateral hip: No acute fracture. Hips located . Moderate narrowing bilateral hips with osteophytes.   MRI: Left hip shows no acute fracture. Moderate joint space narrowing with effusion. Muscle strain with marked intramuscular edema involving the left hip musculature.  Lumbar spine MRI: L5-S1 grade 3 anterolisthesis with moderate to severe bilateral foraminal narrowing. L4-L5 disc protrusion resulting in moderate left foramina stenosis.   Assessment/Plan:    Up with therapy WBAT left lower extremity. Left leg pain felt to be most likely from hip OA and muscle strain . Recommend left hip aspiration and steroid injection by Interventional Radiology for diagnostic / therapeutic  purposes. (Discussed patient's clinical findings with Dr.Blackman and he was in agreement with hip injection / aspiration. ) Will continue to follow patient during hospitalization.        Rachel Pierce 02/13/2024, 9:40 AM

## 2024-02-13 NOTE — Progress Notes (Signed)
 SATURATION QUALIFICATIONS: (This note is used to comply with regulatory documentation for home oxygen)  Patient Saturations on Room Air at Rest = 98%  Patient Saturations on Room Air while Ambulating = 97%  Patient Saturations on 0 Liters of oxygen while Ambulating = 96%  Please briefly explain why patient needs home oxygen:

## 2024-02-13 NOTE — Evaluation (Signed)
 Occupational Therapy Evaluation Patient Details Name: Rachel Pierce MRN: [id number] DOB: 11/13/1934 Today's Date: 02/13/2024   History of Present Illness   Pt admitted from home with back pain, L side sciatica after attempting to assist spouse from floor ~ 1 week ago.  Pt with hx of L TKR in 2016     Clinical Impressions Pt admitted with the above. Pt currently with functional limitations due to the deficits listed below (see OT Problem List).  Pt will benefit from acute skilled OT to increase their safety and independence with ADL and functional mobility for ADL to facilitate discharge.    Pt VERy pleasant and determined.  Feel pt could benefit from inpatient rehab to increase I with ADL activity and return home. Pt has supportive sons and family. Married almost 60 years!     If plan is discharge home, recommend the following:   Assistance with cooking/housework;A lot of help with bathing/dressing/bathroom;A lot of help with walking and/or transfers     Functional Status Assessment   Patient has had a recent decline in their functional status and demonstrates the ability to make significant improvements in function in a reasonable and predictable amount of time.     Equipment Recommendations   BSC/3in1     Recommendations for Other Services   Rehab consult     Precautions/Restrictions   Precautions Precautions: Fall Restrictions Weight Bearing Restrictions Per Provider Order: Yes Other Position/Activity Restrictions: WBAT     Mobility Bed Mobility Overal bed mobility: Needs Assistance Bed Mobility: Supine to Sit     Supine to sit: Mod assist, +2 for physical assistance, +2 for safety/equipment     General bed mobility comments: Increased time with assist to manage L LE, to bring trunk to upright and to complete rotation to EOB sitting    Transfers Overall transfer level: Needs assistance Equipment used: Rolling walker (2 wheels) Transfers: Sit  to/from Stand, Bed to chair/wheelchair/BSC Sit to Stand: Mod assist, +2 physical assistance, +2 safety/equipment, From elevated surface     Step pivot transfers: Min assist, +2 physical assistance, +2 safety/equipment, From elevated surface     General transfer comment: cues for LE manatgement and use of UEs to self assist;  Physical assist to bring wt up and fwd and to balance in standing with RW.  Step by step cues to step pvt with RW from bed to recliner - pt initially unable to bring L heel to floor but progressed to allow same with first few steps.      Balance Overall balance assessment: Needs assistance Sitting-balance support: No upper extremity supported, Feet supported Sitting balance-Leahy Scale: Fair     Standing balance support: Bilateral upper extremity supported Standing balance-Leahy Scale: Poor                             ADL either performed or assessed with clinical judgement   ADL Overall ADL's : Needs assistance/impaired Eating/Feeding: Set up;Sitting   Grooming: Wash/dry face;Minimal assistance;Sitting   Upper Body Bathing: Minimal assistance;Sitting   Lower Body Bathing: Maximal assistance;Sit to/from stand;Sitting/lateral leans;+2 for safety/equipment   Upper Body Dressing : Minimal assistance;Sitting   Lower Body Dressing: Maximal assistance;+2 for safety/equipment;+2 for physical assistance;Sit to/from stand;Sitting/lateral leans                 General ADL Comments: bed to chair only this OT session. Pt VERY motivated and did well.     Vision Patient  Visual Report: No change from baseline              Pertinent Vitals/Pain Pain Assessment Pain Assessment: Faces Faces Pain Scale: Hurts even more Pain Location: back, L hip and LE Pain Descriptors / Indicators: Aching, Grimacing, Guarding, Sore Pain Intervention(s): Limited activity within patient's tolerance, Monitored during session, Premedicated before session      Extremity/Trunk Assessment Upper Extremity Assessment Upper Extremity Assessment: Defer to OT evaluation   Lower Extremity Assessment Lower Extremity Assessment: Generalized weakness LLE: Unable to fully assess due to pain   Cervical / Trunk Assessment Cervical / Trunk Assessment: Kyphotic   Communication Communication Communication: Impaired Factors Affecting Communication: Hearing impaired   Cognition Arousal: Alert Behavior During Therapy: Anxious                                 Following commands: Intact       Cueing  General Comments   Cueing Techniques: Verbal cues;Gestural cues              Home Living Family/patient expects to be discharged to:: Private residence Living Arrangements: Spouse/significant other;Children Available Help at Discharge: Family;Available PRN/intermittently Type of Home: House Home Access: Stairs to enter [employer] of Steps: 3 Entrance Stairs-Rails: Right Home Layout: One level     Bathroom Shower/Tub: Producer, Television/film/video: Handicapped height     Home Equipment: Agricultural Consultant (2 wheels)          Prior Functioning/Environment Prior Level of Function : Independent/Modified Independent                    OT Problem List: Decreased strength;Impaired vision/perception;Impaired balance (sitting and/or standing);Pain   OT Treatment/Interventions: Self-care/ADL training;Patient/family education;Therapeutic activities      OT Goals(Current goals can be found in the care plan section)   Acute Rehab OT Goals Patient Stated Goal: get well OT Goal Formulation: With patient Time For Goal Achievement: 02/27/24 Potential to Achieve Goals: Good ADL Goals Pt Will Perform Grooming: with modified independence;standing Pt Will Perform Lower Body Dressing: with modified independence;sit to/from stand Pt Will Transfer to Toilet: with modified independence;regular height  toilet;ambulating Pt Will Perform Toileting - Clothing Manipulation and hygiene: with modified independence;sit to/from stand;sitting/lateral leans   OT Frequency:  Min 2X/week    Co-evaluation   Reason for Co-Treatment: For patient/therapist safety;To address functional/ADL transfers PT goals addressed during session: Mobility/safety with mobility OT goals addressed during session: ADL's and self-care      AM-PAC OT 6 Clicks Daily Activity     Outcome Measure Help from another person eating meals?: None Help from another person taking care of personal grooming?: A Little Help from another person toileting, which includes using toliet, bedpan, or urinal?: Total Help from another person bathing (including washing, rinsing, drying)?: A Lot Help from another person to put on and taking off regular upper body clothing?: A Little Help from another person to put on and taking off regular lower body clothing?: A Lot 6 Click Score: 15   End of Session Equipment Utilized During Treatment: Gait belt;Rolling walker (2 wheels) Nurse Communication: Mobility status  Activity Tolerance: Patient tolerated treatment well Patient left: in chair;with call bell/phone within reach;with nursing/sitter in room  OT Visit Diagnosis: Unsteadiness on feet (R26.81);Other abnormalities of gait and mobility (R26.89);History of falling (Z91.81);Pain Pain - Right/Left: Left Pain - part of body: Hip  Time: 9070-9041 OT Time Calculation (min): 29 min Charges:  OT General Charges $OT Visit: 1 Visit OT Evaluation $OT Eval Low Complexity: 1 Low    Fany Cavanaugh, Norvel BIRCH 02/13/2024, 12:47 PM

## 2024-02-13 NOTE — Progress Notes (Signed)
 Delon, RN from ICU was SWOT nurse this afternoon, and did wound care according to the [facility] nurse's specifications and order.

## 2024-02-13 NOTE — Progress Notes (Signed)
 [facility]                                                                              Rachel Pierce, is a 89 y.o. female, DOB - 12/26/1934, FMW:[id number] Admit date - 02/12/2024    Outpatient Primary MD for the patient is Lucius Krabbe, NP  LOS - 1  days  Chief Complaint  Patient presents with   Hip Pain       Brief summary   Patient is 89 year old female with diabetes mellitus type 2, HTN, CKD stage II, hypothyroidism, HLD, osteoporosis, presented with left leg pain.  Per patient, on Thursday, 3 days PTA she was helping her husband to get up from the ground and pulled her left hip.  Since then she has not been able to ambulate secondary to pain.  Not on anticoagulation or falls NAD, noted to have a temp of 100.7 F, complained of dysuria and foul odor of the urine.  No nausea vomiting, abdominal pain or diarrhea. Flu, RSV, COVID-negative  Assessment & Plan     Left hip pain, left knee pain -On exam, patient reporting severe left knee pain and not the back pain  - MRI of the left hip showed no acute displaced fracture, marked intramuscular edema within the medial left hip throughout the obturator and abductor musculature consistent of severe muscle strain, moderate left hip effusion, bilateral moderate hip osteoarthritis - MRI of the lumbar spine showed grade 3 anterolisthesis at L5-S1 with moderate to severe bilateral foraminal narrowing broad-based disc protrusion at L3-4 resulting in moderate central and right foraminal stenosis, left disc protrusion at L4-5 - Obtain left knee x-ray - Continue pain control, orthopedics consulted, awaiting further recommendations  UTI - Follow urine culture and sensitivities, continue IV Rocephin   Hyperlipidemia - Continue statin  Diabetes mellitus type 2, NIDDM - Hemoglobin A1c 6.1 CBG (last 3)  Recent Labs    02/12/24 2122 02/13/24 0731  GLUCAP 175* 140*  Continue sliding scale insulin  while  inpatient  Essential hypertension Continue Toprol -XL  Sciatica left side - Received a dose of prednisone , will await orthopedic consult     Stage II decubitus ulcer (HCC) Pressure Injury Documentation: Wound 02/12/24 2100 Pressure Injury Buttocks Right Stage 2 -  Partial thickness loss of dermis presenting as a shallow open injury with a red, pink wound bed without slough. (Active)  - Wound care consult   Estimated body mass index is 27.59 kg/m as calculated from the following:   Height as of this encounter: 5' 1 (1.549 m).   Weight as of this encounter: 66.2 kg.  Code Status: Full code DVT Prophylaxis:  SCDs Start: 02/12/24 2118   Level of Care: Level of care: Telemetry Family Communication: Updated patient Disposition Plan:      Remains inpatient appropriate:      Procedures:    Consultants:   Orthopedics  Antimicrobials:   Anti-infectives (From admission, onward)    Start     Dose/Rate Route Frequency Ordered Stop   02/13/24 0200  cefTRIAXone  (ROCEPHIN ) 1 g in sodium chloride  0.9 % 100 mL IVPB        1 g  200 mL/hr over 30 Minutes Intravenous Every 24 hours 02/13/24 0031            Medications  docusate sodium   100 mg Oral BID   insulin  aspart  0-9 Units Subcutaneous TID WC   levothyroxine   88 mcg Oral QAC breakfast   metoprolol  succinate  12.5 mg Oral Daily   nystatin    Topical TID   pantoprazole   40 mg Oral Daily   pravastatin   20 mg Oral Daily   senna  1 tablet Oral BID      Subjective:   Rachel Pierce was seen and examined today.  Complaining of severe left knee pain, 10 out of 10. Denies enies dizziness, chest pain, shortness of breath, abdominal pain, N/V/D/C, new weakness, numbess.  Objective:   Vitals:   02/12/24 1817 02/12/24 2108 02/13/24 0154 02/13/24 0540  BP: (!) 156/56 (!) 157/76 (!) 140/58 130/65  Pulse: 86 93 80 76  Resp: 16 18 18 18   Temp:  98.6 F (37 C) 97.8 F (36.6 C) 97.8 F (36.6 C)  TempSrc:  Oral Oral Oral   SpO2: 95% 94% 95% 94%  Weight:      Height:        Intake/Output Summary (Last 24 hours) at 02/13/2024 9061 Last data filed at 02/13/2024 0342 Gross per 24 hour  Intake 530.38 ml  Output 500 ml  Net 30.38 ml     Wt Readings from Last 3 Encounters:  02/12/24 66.2 kg  01/18/24 66.3 kg  12/23/17 65.8 kg     Exam General: Alert and oriented x 3, NAD Cardiovascular: S1 S2 auscultated,  RRR Respiratory: Clear to auscultation bilaterally, no wheezing, rales or rhonchi Gastrointestinal: Soft, nontender, nondistended, + bowel sounds Ext: no pedal edema bilaterally Neuro: Able to move all 4 extremities however complaining of severe left knee pain.  ROM decreased Psych: Normal affect     Data Reviewed:  I have personally reviewed following labs    CBC Lab Results  Component Value Date   WBC 9.1 02/13/2024   RBC 4.78 02/13/2024   HGB 14.2 02/13/2024   HCT 43.4 02/13/2024   MCV 90.8 02/13/2024   MCH 29.7 02/13/2024   PLT 292 02/13/2024   MCHC 32.7 02/13/2024   RDW 12.2 02/13/2024     Last metabolic panel Lab Results  Component Value Date   NA 138 02/13/2024   K 5.3 (H) 02/13/2024   CL 102 02/13/2024   CO2 25 02/13/2024   BUN 18 02/13/2024   CREATININE 0.80 02/13/2024   GLUCOSE 209 (H) 02/13/2024   GFRNONAA >60 02/13/2024   GFRAA >60 07/26/2014   CALCIUM 9.9 02/13/2024   PHOS 4.4 02/13/2024   PROT 7.0 02/13/2024   ALBUMIN 3.4 (L) 02/13/2024   BILITOT 0.6 02/13/2024   ALKPHOS 80 02/13/2024   AST 25 02/13/2024   ALT 15 02/13/2024   ANIONGAP 10 02/13/2024    CBG (last 3)  Recent Labs    02/12/24 2122 02/13/24 0731  GLUCAP 175* 140*      Coagulation Profile: No results for input(s): INR, PROTIME in the last 168 hours.   Radiology Studies: I have personally reviewed the imaging studies  DG Chest 2 View Result Date: 02/12/2024 CLINICAL DATA:  Fever, left hip pain EXAM: CHEST - 2 VIEW COMPARISON:  07/26/2014 FINDINGS: Frontal and lateral views  of the chest demonstrate an unremarkable cardiac silhouette. No acute airspace disease, effusion, or pneumothorax. No acute bony abnormalities. IMPRESSION: 1. No acute intrathoracic process. Electronically Signed  By: Ozell Daring M.D.   On: 02/12/2024 20:19   MR HIP LEFT WO CONTRAST Result Date: 02/12/2024 CLINICAL DATA:  Lower back pain radiating to left hip for 1 week, negative x-ray EXAM: MR OF THE LEFT HIP WITHOUT CONTRAST TECHNIQUE: Multiplanar, multisequence MR imaging was performed. No intravenous contrast was administered. COMPARISON:  02/12/2024 FINDINGS: Bones: There are no acute displaced fractures. There are no destructive bony abnormalities. Within the left iliac bone adjacent to the SI joint there is a 2.2 x 4.4 x 2.9 cm region demonstrating heterogeneous increased T2 signal, likely benign etiology such as enchondroma. Articular cartilage and labrum Articular cartilage: There is moderate symmetrical bilateral hip joint space narrowing and osteophyte formation compatible with osteoarthritis. Labrum:  No gross abnormality. Joint or bursal effusion Joint effusion:  Moderate left hip effusion.  No right hip effusion. Bursae: Unremarkable. Muscles and tendons Muscles and tendons: There is abnormal increased T2 signal within the abductor musculature of the medial left hip. Intramuscular edema is seen within the operator internus, obturator externus, and abductor longus and brevis muscles. Findings are compatible with strain. No evidence of muscle tear. Other findings Miscellaneous: Uterus is atrophic, with cystic changes in the endometrium measuring up to 12 mm. Further evaluation with nonemergent outpatient pelvic ultrasound may prove useful. IMPRESSION: 1. No acute displaced fracture. 2. Marked intramuscular edema within the medial left hip throughout the obturator and abductor musculature, consistent with severe muscle strain. No evidence of muscle tear. 3. Moderate left hip effusion. 4. Moderate  symmetrical bilateral hip osteoarthritis. Electronically Signed   By: Ozell Daring M.D.   On: 02/12/2024 17:44   MR LUMBAR SPINE WO CONTRAST Result Date: 02/12/2024 EXAM: MRI LUMBAR SPINE 02/12/2024 04:00:43 PM TECHNIQUE: Multiplanar multisequence MRI of the lumbar spine was performed without the administration of intravenous contrast. COMPARISON: Lumbar spine radiographs 02/03/2024. CLINICAL HISTORY: Myelopathy, acute, lumbar spine. Severe low back pain extending into the left lower extremity. FINDINGS: BONES AND ALIGNMENT: Normal vertebral body heights. Bone marrow signal is unremarkable except for edematous endplate marrow changes present posteriorly at L4-L5. L5 pars defects are present bilaterally. Grade 3 anterolisthesis measures 15 mm at L5-S1. Slight retrolisthesis is present at L4-L5. SPINAL CORD: The conus terminates normally. SOFT TISSUES: No paraspinal mass. L1-L2: Mild disc bulging is present at L1-L2 without significant stenosis. L2-L3: A broad-based disc protrusion and moderate facet hypertrophy are present bilaterally at L2-L3. No significant stenosis is present. L3-L4: A broad-based disc protrusion and advanced facet hypertrophy at L3-L4 result in moderate central and right foraminal stenosis. Left foraminal narrowing is mild. L4-L5: A leftward disc protrusion at L4-L5 results in moderate left foraminal stenosis. L5-S1: The anterolisthesis at L5-S1 results in moderate to severe foraminal narrowing bilaterally. LEFT KIDNEY: A simple cyst at the upper pole of the left kidney measures 4.3 cm maximally. IMPRESSION: 1. Grade 3 anterolisthesis at L5-S1 with moderate to severe bilateral foraminal narrowing. 2. Broad-based disc protrusion and advanced facet hypertrophy at L3-4 resulting in moderate central and right foraminal stenosis, with mild left foraminal narrowing. 3. Leftward disc protrusion at L4-5 resulting in moderate left foraminal stenosis. Electronically signed by: Lonni Necessary MD  02/12/2024 04:16 PM EST RP Workstation: [id number]   DG Hip Unilat W or Wo Pelvis 2-3 Views Left Result Date: 02/12/2024 CLINICAL DATA:  pain EXAM: DG HIP (WITH OR WITHOUT PELVIS) 2-3V LEFT COMPARISON:  December 23, 2017 FINDINGS: Osteopenia. No acute fracture or dislocation. Joint space narrowing and osteophyte formation of bilateral hips, favored progressed since 2019. Sacrum  is obscured by overlapping bowel contents. No area of erosion or osseous destruction. No unexpected radiopaque foreign body. Pelvic phleboliths. IMPRESSION: 1. No acute fracture or dislocation. 2. If there is a persistent clinical concern for nondisplaced hip or pelvic fracture, recommend dedicated pelvic CT or MRI. Electronically Signed   By: Corean Salter M.D.   On: 02/12/2024 14:09   DG Knee Complete 4 Views Left Result Date: 02/12/2024 CLINICAL DATA:  pain EXAM: LEFT KNEE - COMPLETE 4+ VIEW COMPARISON:  February 03, 2024 FINDINGS: Status post knee arthroplasty. Orthopedic hardware is intact and without periprosthetic fracture or lucency. Osteopenia. No acute fracture or dislocation. Soft tissues are unremarkable. IMPRESSION: Status post knee arthroplasty without evidence of hardware complication. Electronically Signed   By: Corean Salter M.D.   On: 02/12/2024 14:07       Rachel Pierce M.D. [facility] 02/13/2024, 9:38 AM  Available via Epic secure chat 7am-7pm After 7 pm, please refer to night coverage provider listed on amion.

## 2024-02-13 NOTE — Consult Note (Addendum)
 WOC Nurse Consult Note: Reason for Consult: sacral wound  Wound type: 1.  Stage 1 Pressure Injury superior sacrum with central and  linear erythema  2.  Deep tissue Pressure Injury B medial upper buttocks  L with purple maroon discoloration some lifting of epidermis, R upper buttock DTPI that has now evolved to unstageable Pressure Injury with 80% necrotic tissue 20% red moist  Pressure Injury POA: Yes Measurement: see nursing flowsheet  Wound bed: as above  Drainage (amount, consistency, odor) see nursing flowsheet  Periwound: erythema  Dressing procedure/placement/frequency:  Cleanse B medial buttocks wounds with Vashe, do not rinse.  Apply Xeroform gauze to purple discoloration L buttock.  Apply 1/4 thick layer of Santyl  to wound bed of R buttock top with saline moist gauze, dry gauze and secure with ABD pad and clothe tape or silicone foam whichever is preferred.  Cover upper sacral Stage 1 with silicone foam, lift foam daily to assess and replace foam q3 days and prn soiling.    POC discussed with bedside nurse.Appreciate P. Raynaldo, RN assistance with this consult.  WOC team will not follow. Deep Tissue Pressure Injuries are high risk to deteriorate. Please reconsult if needed.    Thank you,    Powell Bar MSN, RN-BC, [employer]

## 2024-02-13 NOTE — Evaluation (Signed)
 Physical Therapy Evaluation Patient Details Name: Rachel Pierce MRN: [id number] DOB: [birth year] Today's Date: 02/13/2024  History of Present Illness  Pt admitted from home with back pain, L side sciatica after attempting to assist spouse from floor ~ 1 week ago.  Pt with hx of L TKR in 2016  Clinical Impression  Pt admitted as above and presenting with functional mobility limitations 2* decreased activity tolerance, ongoing L hip/back/LE pain, balance deficits and elevated anxiety level.  Prior to admit, pt was fully IND and would benefit from intensive inpatient follow-up therapy, >3 hours/day to maximize IND and safety and return to PLOF.         If plan is discharge home, recommend the following: Two people to help with walking and/or transfers;A lot of help with bathing/dressing/bathroom;Assistance with cooking/housework;Assist for transportation;Help with stairs or ramp for entrance   Can travel by private vehicle        Equipment Recommendations None recommended by PT  Recommendations for Other Services       Functional Status Assessment Patient has had a recent decline in their functional status and demonstrates the ability to make significant improvements in function in a reasonable and predictable amount of time.     Precautions / Restrictions Precautions Precautions: Fall Restrictions Weight Bearing Restrictions Per Provider Order: Yes Other Position/Activity Restrictions: WBAT      Mobility  Bed Mobility Overal bed mobility: Needs Assistance Bed Mobility: Supine to Sit     Supine to sit: Mod assist, +2 for physical assistance, +2 for safety/equipment     General bed mobility comments: Increased time with assist to manage L LE, to bring trunk to upright and to complete rotation to EOB sitting    Transfers Overall transfer level: Needs assistance Equipment used: Rolling walker (2 wheels) Transfers: Sit to/from Stand, Bed to chair/wheelchair/BSC Sit to  Stand: Mod assist, +2 physical assistance, +2 safety/equipment, From elevated surface   Step pivot transfers: Min assist, +2 physical assistance, +2 safety/equipment, From elevated surface       General transfer comment: cues for LE manatgement and use of UEs to self assist;  Physical assist to bring wt up and fwd and to balance in standing with RW.  Step by step cues to step pvt with RW from bed to recliner - pt initially unable to bring L heel to floor but progressed to allow same with first few steps.    Ambulation/Gait               General Gait Details: step pvt bed top recliner only  Stairs            Wheelchair Mobility     Tilt Bed    Modified Rankin (Stroke Patients Only)       Balance Overall balance assessment: Needs assistance Sitting-balance support: No upper extremity supported, Feet supported Sitting balance-Leahy Scale: Fair     Standing balance support: Bilateral upper extremity supported Standing balance-Leahy Scale: Poor                               Pertinent Vitals/Pain Pain Assessment Pain Assessment: Faces Faces Pain Scale: Hurts even more Pain Location: back, L hip and LE Pain Descriptors / Indicators: Aching, Grimacing, Guarding, Sore Pain Intervention(s): Limited activity within patient's tolerance, Monitored during session, Premedicated before session    Home Living Family/patient expects to be discharged to:: Private residence Living Arrangements: Spouse/significant other;Children Available Help at Discharge:  Family;Available PRN/intermittently Type of Home: House Home Access: Stairs to enter Entrance Stairs-Rails: Right Entrance Stairs-Number of Steps: 3   Home Layout: One level Home Equipment: [employer]ant (2 wheels)      Prior Function Prior Level of Function : Independent/Modified Independent                     Extremity/Trunk Assessment   Upper Extremity Assessment Upper Extremity  Assessment: Defer to OT evaluation    Lower Extremity Assessment Lower Extremity Assessment: Generalized weakness LLE: Unable to fully assess due to pain    Cervical / Trunk Assessment Cervical / Trunk Assessment: Kyphotic  Communication   Communication Communication: Impaired Factors Affecting Communication: Hearing impaired    Cognition Arousal: Alert Behavior During Therapy: Anxious   PT - Cognitive impairments: No apparent impairments                         Following commands: Intact       Cueing Cueing Techniques: Verbal cues, Gestural cues     General Comments      Exercises     Assessment/Plan    PT Assessment Patient needs continued PT services  PT Problem List Decreased strength;Decreased range of motion;Decreased activity tolerance;Decreased balance;Decreased mobility;Decreased knowledge of use of DME;Pain       PT Treatment Interventions DME instruction;Gait training;Stair training;Functional mobility training;Therapeutic activities;Therapeutic exercise;Patient/family education;Balance training    PT Goals (Current goals can be found in the Care Plan section)  Acute Rehab PT Goals Patient Stated Goal: Regain IND PT Goal Formulation: With patient Time For Goal Achievement: 02/27/24 Potential to Achieve Goals: Fair    Frequency Min 2X/week     Co-evaluation PT/OT/SLP Co-Evaluation/Treatment: Yes Reason for Co-Treatment: For patient/therapist safety;To address functional/ADL transfers PT goals addressed during session: Mobility/safety with mobility OT goals addressed during session: ADL's and self-care       AM-PAC PT 6 Clicks Mobility  Outcome Measure Help needed turning from your back to your side while in a flat bed without using bedrails?: A Lot Help needed moving from lying on your back to sitting on the side of a flat bed without using bedrails?: A Lot Help needed moving to and from a bed to a chair (including a  wheelchair)?: A Lot Help needed standing up from a chair using your arms (e.g., wheelchair or bedside chair)?: A Lot Help needed to walk in hospital room?: Total Help needed climbing 3-5 steps with a railing? : Total 6 Click Score: 10    End of Session Equipment Utilized During Treatment: Gait belt Activity Tolerance: Patient limited by fatigue;Patient limited by pain Patient left: in chair;with call bell/phone within reach;with chair alarm set;with nursing/sitter in room Nurse Communication: Mobility status PT Visit Diagnosis: Difficulty in walking, not elsewhere classified (R26.2);Muscle weakness (generalized) (M62.81);Pain Pain - Right/Left: Left Pain - part of body: Leg    Time: 0933-1002 PT Time Calculation (min) (ACUTE ONLY): 29 min   Charges:   PT Evaluation $PT Eval Low Complexity: 1 Low   PT General Charges $$ ACUTE PT VISIT: 1 Visit         [facility] PT Acute Rehabilitation Services Office [phone]   Nivia Gervase 02/13/2024, 1:23 PM

## 2024-02-14 ENCOUNTER — Inpatient Hospital Stay (HOSPITAL_COMMUNITY)

## 2024-02-14 DIAGNOSIS — R509 Fever, unspecified: Secondary | ICD-10-CM | POA: Diagnosis not present

## 2024-02-14 DIAGNOSIS — M5432 Sciatica, left side: Secondary | ICD-10-CM | POA: Diagnosis not present

## 2024-02-14 DIAGNOSIS — T148XXA Other injury of unspecified body region, initial encounter: Secondary | ICD-10-CM | POA: Diagnosis not present

## 2024-02-14 DIAGNOSIS — N3000 Acute cystitis without hematuria: Secondary | ICD-10-CM

## 2024-02-14 DIAGNOSIS — M25552 Pain in left hip: Secondary | ICD-10-CM | POA: Diagnosis not present

## 2024-02-14 LAB — CBC
HCT: 40.1 % (ref 36.0–46.0)
Hemoglobin: 13.5 g/dL (ref 12.0–15.0)
MCH: 30.1 pg (ref 26.0–34.0)
MCHC: 33.7 g/dL (ref 30.0–36.0)
MCV: 89.5 fL (ref 80.0–100.0)
Platelets: 285 K/uL (ref 150–400)
RBC: 4.48 MIL/uL (ref 3.87–5.11)
RDW: 12.6 % (ref 11.5–15.5)
WBC: 8.5 K/uL (ref 4.0–10.5)
nRBC: 0 % (ref 0.0–0.2)

## 2024-02-14 LAB — GLUCOSE, CAPILLARY
Glucose-Capillary: 91 mg/dL (ref 70–99)
Glucose-Capillary: 129 mg/dL — ABNORMAL HIGH (ref 70–99)
Glucose-Capillary: 145 mg/dL — ABNORMAL HIGH (ref 70–99)
Glucose-Capillary: 238 mg/dL — ABNORMAL HIGH (ref 70–99)

## 2024-02-14 LAB — RENAL FUNCTION PANEL
Albumin: 3.1 g/dL — ABNORMAL LOW (ref 3.5–5.0)
Anion gap: 10 (ref 5–15)
BUN: 21 mg/dL (ref 8–23)
CO2: 24 mmol/L (ref 22–32)
Calcium: 9 mg/dL (ref 8.9–10.3)
Chloride: 107 mmol/L (ref 98–111)
Creatinine, Ser: 0.75 mg/dL (ref 0.44–1.00)
GFR, Estimated: >60 mL/min (ref >60)
Glucose, Bld: 105 mg/dL — ABNORMAL HIGH (ref 70–99)
Phosphorus: 2.3 mg/dL — ABNORMAL LOW (ref 2.5–4.6)
Potassium: 4 mmol/L (ref 3.5–5.1)
Sodium: 141 mmol/L (ref 135–145)

## 2024-02-14 MED ORDER — LIDOCAINE HCL (PF) 1 % IJ SOLN
5.0000 mL | Freq: Once | INTRAMUSCULAR | Status: AC
  Administered 2024-02-14: 5 mL via INTRADERMAL
  Filled 2024-02-14: qty 5

## 2024-02-14 MED ORDER — METHYLPREDNISOLONE ACETATE 40 MG/ML IJ SUSP
INTRAMUSCULAR | Status: AC
Start: 2024-02-14 — End: 2024-02-14
  Filled 2024-02-14: qty 1

## 2024-02-14 MED ORDER — LIDOCAINE HCL (PF) 1 % IJ SOLN
INTRAMUSCULAR | Status: AC
  Filled 2024-02-14: qty 5

## 2024-02-14 MED ORDER — BUPIVACAINE HCL 0.25 % IJ SOLN
30.0000 mL | Freq: Once | INTRAMUSCULAR | Status: AC
  Administered 2024-02-14: 5 mL
  Filled 2024-02-14: qty 30

## 2024-02-14 MED ORDER — METHYLPREDNISOLONE ACETATE 80 MG/ML IJ SUSP
80.0000 mg | Freq: Once | INTRAMUSCULAR | Status: AC
  Administered 2024-02-14: 80 mg via INTRA_ARTICULAR
  Filled 2024-02-14: qty 1

## 2024-02-14 MED ORDER — BUPIVACAINE HCL (PF) 0.25 % IJ SOLN
INTRAMUSCULAR | Status: AC
  Filled 2024-02-14: qty 30

## 2024-02-14 MED ORDER — IOHEXOL 300 MG/ML  SOLN
50.0000 mL | Freq: Once | INTRAMUSCULAR | Status: AC | PRN
Start: 2024-02-14 — End: 2024-02-14
  Administered 2024-02-14: 5 mL via INTRA_ARTICULAR

## 2024-02-14 MED ORDER — BUPIVACAINE HCL (PF) 0.5 % IJ SOLN
INTRAMUSCULAR | Status: AC
  Filled 2024-02-14: qty 30

## 2024-02-14 MED ORDER — METHYLPREDNISOLONE SODIUM SUCC 125 MG IJ SOLR
INTRAMUSCULAR | Status: AC
  Filled 2024-02-14: qty 2

## 2024-02-14 MED ORDER — METHYLPREDNISOLONE ACETATE 80 MG/ML IJ SUSP
INTRAMUSCULAR | Status: AC
  Filled 2024-02-14: qty 1

## 2024-02-14 NOTE — Progress Notes (Signed)
 PHYSICAL THERAPY  Checked on Pt after 4pm.  Pt visiting with a visitor and requesting to walk tomorrow.  Katheryn Leap  PTA Acute  Rehabilitation Services Office M-F          [phone]

## 2024-02-14 NOTE — TOC Initial Note (Signed)
 Transition of Care [facility]) - Initial/Assessment Note    Patient Details  Name: Rachel Pierce MRN: [id number] Date of Birth: 05/27/1934  Transition of Care [facility]) CM/SW Contact:    Alfonse JONELLE Rex, RN Phone Number: 02/14/2024, 3:19 PM  Clinical Narrative:   PT eval completed, recommendation for short term rehab. Patient off floor in a procedure. Call to patient's son , Lamar, introduce self and role of INPT CM, Lamar agreeable to short term rehab, prefers [facility]. States patient resides in a private residence with her spouse, both are pretty independent at baseline. Spouse recently at [facility] for short term rehab, spouse fell at home, patient was trying to lift him and injured her hip. FL2 updated, faxed out for bed offers.                 Expected Discharge Plan: Skilled Nursing Facility Barriers to Discharge: Continued Medical Work up   Patient Goals and CMS Choice Patient states their goals for this hospitalization and ongoing recovery are:: return home          Expected Discharge Plan and Services       Living arrangements for the past 2 months: Single Family Home                                      Prior Living Arrangements/Services Living arrangements for the past 2 months: Single Family Home Lives with:: Spouse Patient language and need for interpreter reviewed:: Yes Do you feel safe going back to the place where you live?: Yes      Need for Family Participation in Patient Care: Yes (Comment) Care giver support system in place?: Yes (comment)   Criminal Activity/Legal Involvement Pertinent to Current Situation/Hospitalization: No - Comment as needed  Activities of Daily Living   ADL Screening (condition at time of admission) Independently performs ADLs?: Yes (appropriate for developmental age) Is the patient deaf or have difficulty hearing?: Yes Does the patient have difficulty seeing, even when wearing glasses/contacts?: No Does the patient  have difficulty concentrating, remembering, or making decisions?: Yes  Permission Sought/Granted                  Emotional Assessment Appearance:: Appears stated age       Alcohol / Substance Use: Not Applicable Psych Involvement: No (comment)  Admission diagnosis:  Muscle strain [T14.8XXA] Left hip pain [M25.552] Sciatica of left side [M54.32] Fever, unspecified fever cause [R50.9] Patient Active Problem List   Diagnosis Date Noted   UTI (urinary tract infection) 02/13/2024   Left hip pain 02/12/2024   Fever 02/12/2024   Stage II decubitus ulcer (HCC) 02/12/2024   Controlled type 2 diabetes mellitus without complication, without long-term current use of insulin  (HCC) 02/12/2024   Essential hypertension 02/12/2024   Sciatica of left side 02/12/2024   Acquired hypothyroidism 01/18/2024   Other hyperlipidemia 01/18/2024   Urinary incontinence 01/18/2024   Gastroesophageal reflux disease without esophagitis 01/18/2024   Idiopathic chronic venous hypertension of both lower extremities with inflammation 06/07/2017   Total knee replacement status 08/01/2014   PCP:  Lucius Krabbe, NP Pharmacy:   [id number] [address], [place] - [address] [place] [address] Phone: [phone] Fax: [phone]     Social Drivers of Health (SDOH) Social History: SDOH Screenings   Depression (PHQ2-9): Low Risk  (01/18/2024)  Tobacco Use: Medium Risk (02/12/2024)   SDOH Interventions:  Readmission Risk Interventions    02/14/2024    3:18 PM  Readmission Risk Prevention Plan  Post Dischage Appt Complete  Medication Screening Complete  Transportation Screening Complete

## 2024-02-14 NOTE — Progress Notes (Signed)
 Inpatient Rehab Admissions Coordinator:   Consult received for inpatient rehab, chart reviewed. Patient admitted with L hip/knee pain. No fractures identified on radiographs. Hospital course complicated by UTI. Unfortunately, patient does not have level of medical necessity meet approval for [employer] to warrant this level of care. A lower level of care will need to be pursued. We will sign off.   Rehab Admissons Coordinator Waynetta Metheny, [place], [place] [phone]

## 2024-02-14 NOTE — Progress Notes (Signed)
 PT Note  Patient Details Name: Rachel Pierce MRN: [id number] DOB: 04-Mar-1935   Pt does not meet criteria/does not have a qualifying dx  for AIR. PT recommendations have been updated to reflect SNF. Will continue to follow in acute setting, next PT session to be completed as soon as schedule allows.    [name] 02/14/2024, 1:08 PM

## 2024-02-14 NOTE — Plan of Care (Signed)

## 2024-02-14 NOTE — Plan of Care (Signed)
  Problem: Metabolic: Goal: Ability to maintain appropriate glucose levels will improve Outcome: Progressing   Problem: Nutritional: Goal: Maintenance of adequate nutrition will improve Outcome: Progressing   Problem: Clinical Measurements: Goal: Ability to maintain clinical measurements within normal limits will improve Outcome: Progressing   Problem: Activity: Goal: Risk for activity intolerance will decrease Outcome: Progressing   Problem: Pain Managment: Goal: General experience of comfort will improve and/or be controlled Outcome: Progressing   Problem: Safety: Goal: Ability to remain free from injury will improve Outcome: Progressing   Problem: Skin Integrity: Goal: Risk for impaired skin integrity will decrease Outcome: Progressing

## 2024-02-14 NOTE — Progress Notes (Signed)
 I met with Jenika to provide emotional and spiritual support.  She was very appreciative of the visit and feels that she is doing okay for now.  She feels steady assurance in her faith  and that is a source of support for her and her husband, who has his own health concerns right now. She had been able to have a helpful conversation with a support person this morning and feels at peace now.  I let her know of our ongoing availability for support. Please page as needs arise.  [address] [place] Pager, [phone]

## 2024-02-14 NOTE — [place] FL2 (Signed)
 [place]  MEDICAID FL2 LEVEL OF CARE FORM     IDENTIFICATION  Patient Name: Rachel Pierce Birthdate: [birth year] Sex: female Admission Date (Current Location): 02/12/2024  [facility] and [place] Number:  [employer], [employer]/film/video and Address:  [facility],  [address], [place] [address]      Provider Number: [id number]  Attending Physician Name and Address:  Davia Nydia POUR, MD  Relative Name and Phone Number:  Arraya, Buck)  [phone] [facility])    Current Level of Care: Hospital Recommended Level of Care: Skilled Nursing Facility Prior Approval Number:    Date Approved/Denied:   PASRR Number: [id number]  Discharge Plan: SNF    Current Diagnoses: Patient Active Problem List   Diagnosis Date Noted   UTI (urinary tract infection) 02/13/2024   Left hip pain 02/12/2024   Fever 02/12/2024   Stage II decubitus ulcer (HCC) 02/12/2024   Controlled type 2 diabetes mellitus without complication, without long-term current use of insulin  (HCC) 02/12/2024   Essential hypertension 02/12/2024   Sciatica of left side 02/12/2024   Acquired hypothyroidism 01/18/2024   Other hyperlipidemia 01/18/2024   Urinary incontinence 01/18/2024   Gastroesophageal reflux disease without esophagitis 01/18/2024   Idiopathic chronic venous hypertension of both lower extremities with inflammation 06/07/2017   Total knee replacement status 08/01/2014    Orientation RESPIRATION BLADDER Height & Weight     Self, Time, Situation, Place  Normal Continent, External catheter Weight: 66.2 kg Height:  5' 1 (154.9 cm)  BEHAVIORAL SYMPTOMS/MOOD NEUROLOGICAL BOWEL NUTRITION STATUS      Continent Diet (heart diet)  AMBULATORY STATUS COMMUNICATION OF NEEDS Skin   Limited Assist Verbally (S) Other (Comment) (Stage 2 Pressure Injuru Sacrum w/foam lift dressing; Pressure Injury bilateral buttocks-unstageable w/foam lift dressing)                       Personal Care Assistance  Level of Assistance  Bathing, Feeding, Dressing Bathing Assistance: Limited assistance Feeding assistance: Limited assistance Dressing Assistance: Limited assistance     Functional Limitations Info  Sight, Hearing, Speech Sight Info: Adequate Hearing Info: Adequate Speech Info: Adequate    SPECIAL CARE FACTORS FREQUENCY  PT (By licensed PT), OT (By licensed OT)     PT Frequency: 5x/wk OT Frequency: 5x/wk            Contractures Contractures Info: Not present    Additional Factors Info  Code Status, Allergies, Psychotropic Code Status Info: DNR Allergies Info: Codeine, Latex, Sulfa Antibiotics Psychotropic Info: N/A         Current Medications (02/14/2024):  This is the current hospital active medication list Current Facility-Administered Medications  Medication Dose Route Frequency Provider Last Rate Last Admin   acetaminophen  (TYLENOL ) tablet 650 mg  650 mg Oral Q6H PRN Doutova, Anastassia, MD   650 mg at 02/13/24 2027   Or   acetaminophen  (TYLENOL ) suppository 650 mg  650 mg Rectal Q6H PRN Doutova, Anastassia, MD       cefTRIAXone  (ROCEPHIN ) 1 g in sodium chloride  0.9 % 100 mL IVPB  1 g Intravenous Q24H Doutova, Anastassia, MD   Stopped at 02/14/24 0206   collagenase  (SANTYL ) ointment   Topical Daily Rai, Ripudeep K, MD   Given at 02/14/24 0930   docusate sodium  (COLACE) capsule 100 mg  100 mg Oral BID Doutova, Anastassia, MD   100 mg at 02/14/24 9070   fentaNYL  (SUBLIMAZE ) injection 12.5-50 mcg  12.5-50 mcg Intravenous Q2H PRN Doutova, Anastassia, MD  HYDROcodone -acetaminophen  (NORCO/VICODIN) 5-325 MG per tablet 1-2 tablet  1-2 tablet Oral Q4H PRN Doutova, Anastassia, MD   2 tablet at 02/14/24 9767   insulin  aspart (novoLOG ) injection 0-9 Units  0-9 Units Subcutaneous TID WC Doutova, Anastassia, MD   1 Units at 02/14/24 1310   levothyroxine  (SYNTHROID ) tablet 88 mcg  88 mcg Oral QAC breakfast Doutova, Anastassia, MD   88 mcg at 02/14/24 9474   methocarbamol   (ROBAXIN ) injection 500 mg  500 mg Intravenous Q6H PRN Doutova, Anastassia, MD   500 mg at 02/12/24 2201   methylPREDNISolone  acetate (DEPO-MEDROL ) injection 80 mg  80 mg Intra-articular Once Rai, Ripudeep K, MD       metoprolol  succinate (TOPROL -XL) 24 hr tablet 12.5 mg  12.5 mg Oral Daily Doutova, Anastassia, MD   12.5 mg at 02/14/24 9070   nystatin  (MYCOSTATIN /NYSTOP ) topical powder   Topical TID Doutova, Anastassia, MD   Given at 02/14/24 0930   ondansetron  (ZOFRAN ) tablet 4 mg  4 mg Oral Q6H PRN Doutova, Anastassia, MD       Or   ondansetron  (ZOFRAN ) injection 4 mg  4 mg Intravenous Q6H PRN Doutova, Anastassia, MD   4 mg at 02/13/24 0725   oxyCODONE -acetaminophen  (PERCOCET/ROXICET) 5-325 MG per tablet 1 tablet  1 tablet Oral Q6H PRN Doutova, Anastassia, MD   1 tablet at 02/14/24 9070   pantoprazole  (PROTONIX ) EC tablet 40 mg  40 mg Oral Daily Doutova, Anastassia, MD   40 mg at 02/14/24 0930   polyethylene glycol (MIRALAX  / GLYCOLAX ) packet 17 g  17 g Oral Daily PRN Doutova, Anastassia, MD       pravastatin  (PRAVACHOL ) tablet 20 mg  20 mg Oral Daily Doutova, Anastassia, MD   20 mg at 02/14/24 0930   senna (SENOKOT) tablet 8.6 mg  1 tablet Oral BID Doutova, Anastassia, MD   8.6 mg at 02/14/24 0930     Discharge Medications: Please see discharge summary for a list of discharge medications.  Relevant Imaging Results:  Relevant Lab Results:   Additional Information SSN: [id number]  Alfonse JONELLE Rex, RN

## 2024-02-14 NOTE — Progress Notes (Signed)
 [facility]                                                                              Rachel Pierce, is a 89 y.o. female, DOB - September 10, 1934, FMW:[id number] Admit date - 02/12/2024    Outpatient Primary MD for the patient is Lucius Krabbe, NP  LOS - 2  days  Chief Complaint  Patient presents with   Hip Pain       Brief summary   Patient is 89 year old female with diabetes mellitus type 2, HTN, CKD stage II, hypothyroidism, HLD, osteoporosis, presented with left leg pain.  Per patient, on Thursday, 3 days PTA she was helping her husband to get up from the ground and pulled her left hip.  Since then she has not been able to ambulate secondary to pain.  Not on anticoagulation or falls NAD, noted to have a temp of 100.7 F, complained of dysuria and foul odor of the urine.  No nausea vomiting, abdominal pain or diarrhea. Flu, RSV, COVID-negative  Assessment & Plan     Left hip pain, left knee pain -On exam, patient reporting severe left knee pain and not the back pain  - MRI of the left hip showed no acute displaced fracture, marked intramuscular edema within the medial left hip throughout the obturator and abductor musculature consistent of severe muscle strain, moderate left hip effusion, bilateral moderate hip osteoarthritis - MRI of the lumbar spine showed grade 3 anterolisthesis at L5-S1 with moderate to severe bilateral foraminal narrowing broad-based disc protrusion at L3-4 resulting in moderate central and right foraminal stenosis, left disc protrusion at L4-5 - Left knee x-ray with no periprostatic fracture or lucency - Continue pain control, orthopedics consulted, recommended IR left hip joint aspiration with steroid injection - PT recommended CIR.  However, declined by CIR, does not have level of medical necessity approval from insurance  UTI - Urine culture with more than 100,000 colonies of GNR  - Continue IV Rocephin    Hyperlipidemia -  Continue statin  Diabetes mellitus type 2, NIDDM - Hemoglobin A1c 6.1 CBG (last 3)  Recent Labs    02/13/24 1546 02/13/24 2135 02/14/24 0728  GLUCAP 104* 109* 91   Continue sliding scale insulin  while inpatient  Essential hypertension Continue Toprol -XL      Stage II decubitus ulcer (HCC) Pressure Injury Documentation: Wound 02/13/24 Pressure Injury Sacrum Upper Stage 1 -  Intact skin with non-blanchable redness of a localized area usually over a bony prominence. (Active)     Wound 02/12/24 2100 Pressure Injury Buttocks Right Unstageable - Full thickness tissue loss in which the base of the injury is covered by slough (yellow, tan, gray, green or brown) and/or eschar (tan, brown or black) in the wound bed. (Active)     Wound 02/13/24 1454 Pressure Injury Buttocks Left;Medial Deep Tissue Pressure Injury - Purple or maroon localized area of discolored intact skin or blood-filled blister due to damage of underlying soft tissue from pressure and/or shear. (Active)  - Wound care consult   Estimated body mass index is 27.59 kg/m as calculated from the following:   Height as of this encounter: 5'  1 (1.549 m).   Weight as of this encounter: 66.2 kg.  Code Status: Full code DVT Prophylaxis:  SCDs Start: 02/12/24 2118   Level of Care: Level of care: Med-Surg Family Communication: Updated patient's son at the bedside Disposition Plan:      Remains inpatient appropriate:      Procedures:    Consultants:   Orthopedics  Antimicrobials:   Anti-infectives (From admission, onward)    Start     Dose/Rate Route Frequency Ordered Stop   02/13/24 0200  cefTRIAXone  (ROCEPHIN ) 1 g in sodium chloride  0.9 % 100 mL IVPB        1 g 200 mL/hr over 30 Minutes Intravenous Every 24 hours 02/13/24 0031            Medications  collagenase    Topical Daily   docusate sodium   100 mg Oral BID   insulin  aspart  0-9 Units Subcutaneous TID WC   levothyroxine   88 mcg Oral QAC breakfast    metoprolol  succinate  12.5 mg Oral Daily   nystatin    Topical TID   pantoprazole   40 mg Oral Daily   pravastatin   20 mg Oral Daily   senna  1 tablet Oral BID      Subjective:   Rachel Pierce was seen and examined today.  Still having severe left thigh hip knee pain.  Son at the bedside.  No dizziness, chest pain, shortness of breath, fever chills or abdominal pain.   Objective:   Vitals:   02/13/24 0925 02/13/24 1543 02/13/24 2134 02/14/24 0510  BP: 136/86 (!) 156/51 (!) 119/56 139/70  Pulse: 78 82 75 81  Resp: 16 16 16 16   Temp: 97.9 F (36.6 C) 97.7 F (36.5 C) 98.4 F (36.9 C) 98.2 F (36.8 C)  TempSrc: Oral Oral Oral Oral  SpO2: 96% 98%  94%  Weight:      Height:        Intake/Output Summary (Last 24 hours) at 02/14/2024 1130 Last data filed at 02/14/2024 9377 Gross per 24 hour  Intake 1612.31 ml  Output 300 ml  Net 1312.31 ml     Wt Readings from Last 3 Encounters:  02/12/24 66.2 kg  01/18/24 66.3 kg  12/23/17 65.8 kg    Physical Exam General: Alert and oriented x 3, NAD Cardiovascular: S1 S2 clear, RRR.  Respiratory: CTAB, no wheezing, rales or rhonchi Gastrointestinal: Soft, nontender, nondistended, NBS Ext: no pedal edema bilaterally, unable to lift up left leg due to pain in the thigh and hip Neuro: no new deficits, ROM decreased in LLE Psych: Normal affect   Data Reviewed:  I have personally reviewed following labs    CBC Lab Results  Component Value Date   WBC 8.5 02/14/2024   RBC 4.48 02/14/2024   HGB 13.5 02/14/2024   HCT 40.1 02/14/2024   MCV 89.5 02/14/2024   MCH 30.1 02/14/2024   PLT 285 02/14/2024   MCHC 33.7 02/14/2024   RDW 12.6 02/14/2024     Last metabolic panel Lab Results  Component Value Date   NA 141 02/14/2024   K 4.0 02/14/2024   CL 107 02/14/2024   CO2 24 02/14/2024   BUN 21 02/14/2024   CREATININE 0.75 02/14/2024   GLUCOSE 105 (H) 02/14/2024   GFRNONAA >60 02/14/2024   GFRAA >60 07/26/2014   CALCIUM  9.0 02/14/2024   PHOS 2.3 (L) 02/14/2024   PROT 7.0 02/13/2024   ALBUMIN 3.1 (L) 02/14/2024   BILITOT 0.6 02/13/2024   ALKPHOS  80 02/13/2024   AST 25 02/13/2024   ALT 15 02/13/2024   ANIONGAP 10 02/14/2024    CBG (last 3)  Recent Labs    02/13/24 1546 02/13/24 2135 02/14/24 0728  GLUCAP 104* 109* 91      Coagulation Profile: No results for input(s): INR, PROTIME in the last 168 hours.   Radiology Studies: I have personally reviewed the imaging studies  DG Knee Left Port Result Date: 02/13/2024 EXAM: 1 or 2 VIEW(S) XRAY OF THE KNEE 02/13/2024 10:19:00 AM COMPARISON: 02/12/2024 CLINICAL HISTORY: 89 year old female with pain. FINDINGS: BONES AND JOINTS: Total knee arthroplasty noted. Intact orthopedic hardware without periprosthetic fracture or lucency. No acute fracture. No focal osseous lesion. No joint dislocation. No significant joint effusion. SOFT TISSUES: The soft tissues are unremarkable. IMPRESSION: 1. No periprosthetic fracture or lucency identified about the right knee. Electronically signed by: Helayne Hurst MD 02/13/2024 10:39 AM EST RP Workstation: [id number]   DG Chest 2 View Result Date: 02/12/2024 CLINICAL DATA:  Fever, left hip pain EXAM: CHEST - 2 VIEW COMPARISON:  07/26/2014 FINDINGS: Frontal and lateral views of the chest demonstrate an unremarkable cardiac silhouette. No acute airspace disease, effusion, or pneumothorax. No acute bony abnormalities. IMPRESSION: 1. No acute intrathoracic process. Electronically Signed   By: Ozell Daring M.D.   On: 02/12/2024 20:19   MR HIP LEFT WO CONTRAST Result Date: 02/12/2024 CLINICAL DATA:  Lower back pain radiating to left hip for 1 week, negative x-ray EXAM: MR OF THE LEFT HIP WITHOUT CONTRAST TECHNIQUE: Multiplanar, multisequence MR imaging was performed. No intravenous contrast was administered. COMPARISON:  02/12/2024 FINDINGS: Bones: There are no acute displaced fractures. There are no destructive bony  abnormalities. Within the left iliac bone adjacent to the SI joint there is a 2.2 x 4.4 x 2.9 cm region demonstrating heterogeneous increased T2 signal, likely benign etiology such as enchondroma. Articular cartilage and labrum Articular cartilage: There is moderate symmetrical bilateral hip joint space narrowing and osteophyte formation compatible with osteoarthritis. Labrum:  No gross abnormality. Joint or bursal effusion Joint effusion:  Moderate left hip effusion.  No right hip effusion. Bursae: Unremarkable. Muscles and tendons Muscles and tendons: There is abnormal increased T2 signal within the abductor musculature of the medial left hip. Intramuscular edema is seen within the operator internus, obturator externus, and abductor longus and brevis muscles. Findings are compatible with strain. No evidence of muscle tear. Other findings Miscellaneous: Uterus is atrophic, with cystic changes in the endometrium measuring up to 12 mm. Further evaluation with nonemergent outpatient pelvic ultrasound may prove useful. IMPRESSION: 1. No acute displaced fracture. 2. Marked intramuscular edema within the medial left hip throughout the obturator and abductor musculature, consistent with severe muscle strain. No evidence of muscle tear. 3. Moderate left hip effusion. 4. Moderate symmetrical bilateral hip osteoarthritis. Electronically Signed   By: Ozell Daring M.D.   On: 02/12/2024 17:44   MR LUMBAR SPINE WO CONTRAST Result Date: 02/12/2024 EXAM: MRI LUMBAR SPINE 02/12/2024 04:00:43 PM TECHNIQUE: Multiplanar multisequence MRI of the lumbar spine was performed without the administration of intravenous contrast. COMPARISON: Lumbar spine radiographs 02/03/2024. CLINICAL HISTORY: Myelopathy, acute, lumbar spine. Severe low back pain extending into the left lower extremity. FINDINGS: BONES AND ALIGNMENT: Normal vertebral body heights. Bone marrow signal is unremarkable except for edematous endplate marrow changes present  posteriorly at L4-L5. L5 pars defects are present bilaterally. Grade 3 anterolisthesis measures 15 mm at L5-S1. Slight retrolisthesis is present at L4-L5. SPINAL CORD: The conus terminates normally. SOFT  TISSUES: No paraspinal mass. L1-L2: Mild disc bulging is present at L1-L2 without significant stenosis. L2-L3: A broad-based disc protrusion and moderate facet hypertrophy are present bilaterally at L2-L3. No significant stenosis is present. L3-L4: A broad-based disc protrusion and advanced facet hypertrophy at L3-L4 result in moderate central and right foraminal stenosis. Left foraminal narrowing is mild. L4-L5: A leftward disc protrusion at L4-L5 results in moderate left foraminal stenosis. L5-S1: The anterolisthesis at L5-S1 results in moderate to severe foraminal narrowing bilaterally. LEFT KIDNEY: A simple cyst at the upper pole of the left kidney measures 4.3 cm maximally. IMPRESSION: 1. Grade 3 anterolisthesis at L5-S1 with moderate to severe bilateral foraminal narrowing. 2. Broad-based disc protrusion and advanced facet hypertrophy at L3-4 resulting in moderate central and right foraminal stenosis, with mild left foraminal narrowing. 3. Leftward disc protrusion at L4-5 resulting in moderate left foraminal stenosis. Electronically signed by: Lonni Necessary MD 02/12/2024 04:16 PM EST RP Workstation: [id number]   DG Hip Unilat W or Wo Pelvis 2-3 Views Left Result Date: 02/12/2024 CLINICAL DATA:  pain EXAM: DG HIP (WITH OR WITHOUT PELVIS) 2-3V LEFT COMPARISON:  December 23, 2017 FINDINGS: Osteopenia. No acute fracture or dislocation. Joint space narrowing and osteophyte formation of bilateral hips, favored progressed since 2019. Sacrum is obscured by overlapping bowel contents. No area of erosion or osseous destruction. No unexpected radiopaque foreign body. Pelvic phleboliths. IMPRESSION: 1. No acute fracture or dislocation. 2. If there is a persistent clinical concern for nondisplaced hip or pelvic  fracture, recommend dedicated pelvic CT or MRI. Electronically Signed   By: Corean Salter M.D.   On: 02/12/2024 14:09   DG Knee Complete 4 Views Left Result Date: 02/12/2024 CLINICAL DATA:  pain EXAM: LEFT KNEE - COMPLETE 4+ VIEW COMPARISON:  February 03, 2024 FINDINGS: Status post knee arthroplasty. Orthopedic hardware is intact and without periprosthetic fracture or lucency. Osteopenia. No acute fracture or dislocation. Soft tissues are unremarkable. IMPRESSION: Status post knee arthroplasty without evidence of hardware complication. Electronically Signed   By: Corean Salter M.D.   On: 02/12/2024 14:07       Jovannie Ulibarri M.D. [facility] 02/14/2024, 11:30 AM  Available via Epic secure chat 7am-7pm After 7 pm, please refer to night coverage provider listed on amion.

## 2024-02-15 DIAGNOSIS — M25552 Pain in left hip: Secondary | ICD-10-CM | POA: Diagnosis not present

## 2024-02-15 DIAGNOSIS — R509 Fever, unspecified: Secondary | ICD-10-CM | POA: Diagnosis not present

## 2024-02-15 DIAGNOSIS — M5432 Sciatica, left side: Secondary | ICD-10-CM | POA: Diagnosis not present

## 2024-02-15 DIAGNOSIS — T148XXA Other injury of unspecified body region, initial encounter: Secondary | ICD-10-CM | POA: Diagnosis not present

## 2024-02-15 LAB — RENAL FUNCTION PANEL
Albumin: 3.1 g/dL — ABNORMAL LOW (ref 3.5–5.0)
Anion gap: 13 (ref 5–15)
BUN: 16 mg/dL (ref 8–23)
CO2: 21 mmol/L — ABNORMAL LOW (ref 22–32)
Calcium: 9.7 mg/dL (ref 8.9–10.3)
Chloride: 104 mmol/L (ref 98–111)
Creatinine, Ser: 0.66 mg/dL (ref 0.44–1.00)
GFR, Estimated: >60 mL/min (ref >60)
Glucose, Bld: 157 mg/dL — ABNORMAL HIGH (ref 70–99)
Phosphorus: 3.1 mg/dL (ref 2.5–4.6)
Potassium: 4.4 mmol/L (ref 3.5–5.1)
Sodium: 138 mmol/L (ref 135–145)

## 2024-02-15 LAB — GLUCOSE, CAPILLARY
Glucose-Capillary: 137 mg/dL — ABNORMAL HIGH (ref 70–99)
Glucose-Capillary: 131 mg/dL — ABNORMAL HIGH (ref 70–99)
Glucose-Capillary: 145 mg/dL — ABNORMAL HIGH (ref 70–99)

## 2024-02-15 LAB — URINE CULTURE: Culture: >=100000 — AB

## 2024-02-15 LAB — CBC
HCT: 47.4 % — ABNORMAL HIGH (ref 36.0–46.0)
Hemoglobin: 14.7 g/dL (ref 12.0–15.0)
MCH: 29.6 pg (ref 26.0–34.0)
MCHC: 31 g/dL (ref 30.0–36.0)
MCV: 95.4 fL (ref 80.0–100.0)
Platelets: 248 K/uL (ref 150–400)
RBC: 4.97 MIL/uL (ref 3.87–5.11)
RDW: 12.5 % (ref 11.5–15.5)
WBC: 6.6 K/uL (ref 4.0–10.5)
nRBC: 0 % (ref 0.0–0.2)

## 2024-02-15 MED ORDER — SODIUM CHLORIDE 0.9 % IV SOLN
3.0000 g | Freq: Three times a day (TID) | INTRAVENOUS | Status: DC
  Administered 2024-02-15 – 2024-02-16 (×4): 3 g via INTRAVENOUS
  Filled 2024-02-15 (×5): qty 8

## 2024-02-15 NOTE — TOC Progression Note (Signed)
 Transition of Care [facility]) - Progression Note    Patient Details  Name: ANNELIES COYT MRN: [id number] Date of Birth: 1934/12/07  [facility] CM/SW Contact  Alfonse JONELLE Rex, RN Phone Number: 02/15/2024, 11:45 AM  Clinical Narrative:   Met with patient/family at bedside to review SNF bed offers [facility] [place], [facility]), pt/family accepted bed offer at [place], [place], admit coord w/SNF notified. SNF auth initiated via [facility]. Auth ID [id number], days approved  02/15/2024-02/17/2024. Will transfer when patient medically ready.      Expected Discharge Plan: Skilled Nursing Facility Barriers to Discharge: Continued Medical Work up               Expected Discharge Plan and Services       Living arrangements for the past 2 months: Single Family Home                                       Social Drivers of Health (SDOH) Interventions SDOH Screenings   Depression (PHQ2-9): Low Risk  (01/18/2024)  Tobacco Use: Medium Risk (02/12/2024)    Readmission Risk Interventions    02/14/2024    3:18 PM  Readmission Risk Prevention Plan  Post Dischage Appt Complete  Medication Screening Complete  Transportation Screening Complete

## 2024-02-15 NOTE — Progress Notes (Signed)
 Physical Therapy Treatment Patient Details Name: Rachel Pierce MRN: [id number] DOB: [birth year] Today's Date: 02/15/2024   History of Present Illness Patient is 89 year old female with diabetes mellitus type 2, HTN, CKD stage II, hypothyroidism, HLD, osteoporosis, presented with left leg pain.  Per patient, on Thursday, 3 days PTA she was helping her husband to get up from the ground and pulled her left hip.  Since then she has not been able to ambulate secondary to pain.      MRI of the left hip showed no acute displaced fracture, marked intramuscular edema within the medial left hip throughout the obturator and abductor musculature consistent of severe muscle strain, moderate left hip effusion, bilateral moderate hip osteoarthritis  - MRI of the lumbar spine showed grade 3 anterolisthesis at L5-S1 with moderate to severe bilateral foraminal narrowing broad-based disc protrusion at L3-4 resulting in moderate central and right foraminal stenosis, left disc protrusion at L4-5    PT Comments  PT - Cognition Comments: AxO x 2.5 slight impairement with word finding and admits to a little memory deficit. Pt is HOH which may also attribute. Overall pleasant and following all directions. Pt able to share, she lives home with Spouse at [facility] (first floor) Spouse in Non amb and uses a scooter but he is able to self transfer and self ADL's. Prior, Pt was IND amb with NO AD (used a cane sometimes in community) She was also driving and performing self house chores.  Pt was OOB in recliner.  General transfer comment: 50% VC's for proper hand placement to push up from recliner vs pull on walker.  Pt NOT use to using a walker.  Required increased time to rise as well as VC's for safety with turn completion prior to sit.  Required 50% VC's to reach back prior to sit as Pt tends to sit uncontrolled due to fatigue.  Required 50% VC's on proper walker to self distance for optimal  support/balance. General Gait Details: Very limited amb distance due to Max c/o fatigue/weakness. Dx UTI  Heavy need for walker for support.  Increased LEFT knee pain with increased distance.  LPT has rec Pt will need ST Rehab at SNF to address mobility and functional decline prior to safely returning home.    If plan is discharge home, recommend the following: Two people to help with walking and/or transfers;A lot of help with bathing/dressing/bathroom;Assistance with cooking/housework;Assist for transportation;Help with stairs or ramp for entrance   Can travel by private vehicle     Yes  Equipment Recommendations  None recommended by PT    Recommendations for Other Services       Precautions / Restrictions Precautions Precautions: Fall Restrictions Weight Bearing Restrictions Per Provider Order: No     Mobility  Bed Mobility               General bed mobility comments: Pt OOB in recliner    Transfers Overall transfer level: Needs assistance Equipment used: Rolling walker (2 wheels) Transfers: Sit to/from Stand Sit to Stand: Min assist, Contact guard assist           General transfer comment: 50% VC's for proper hand placement to push up from recliner vs pull on walker.  Pt NOT use to using a walker.  Required increased time to rise as well as VC's for safety with turn completion prior to sit.  Required 50% VC's to reach back prior to sit as Pt tends to sit uncontrolled due to fatigue.  Required 50% VC's on proper walker to self distance for optimal support/balance.    Ambulation/Gait Ambulation/Gait assistance: Min assist Gait Distance (Feet): 22 Feet Assistive device: Rolling walker (2 wheels) Gait Pattern/deviations: Step-to pattern Gait velocity: decreased     General Gait Details: Very limited amb distance due to Max c/o fatigue/weakness. Dx UTI  Heavy need for walker for support.  Increased LEFT knee pain with increased distance.   Stairs              Wheelchair Mobility     Tilt Bed    Modified Rankin (Stroke Patients Only)       Balance                                            Communication    Cognition Arousal: Alert Behavior During Therapy: WFL for tasks assessed/performed   PT - Cognitive impairments: No apparent impairments                       PT - Cognition Comments: AxO x 2.5 slight impairement with word finding and admits to a little memory deficit. Pt is HOH which may also attribute. Overall pleasant and following all directions. Pt able to share, she lives home with Spouse at [facility] (first floor) Spouse in Non amb and uses a scooter but he is able to self transfer and self ADL's. Prior, Pt was IND amb with NO AD (used a cane sometimes in community) She was also driving and performing self house chores.        Cueing    Exercises      General Comments        Pertinent Vitals/Pain Pain Assessment Pain Assessment: Faces Faces Pain Scale: Hurts a little bit Pain Location: LEFT knee (arthritis) Pain Descriptors / Indicators: Aching, Discomfort Pain Intervention(s): Monitored during session    Home Living                          Prior Function            PT Goals (current goals can now be found in the care plan section) Progress towards PT goals: Progressing toward goals    Frequency    Min 2X/week      PT Plan      Co-evaluation              AM-PAC PT 6 Clicks Mobility   Outcome Measure  Help needed turning from your back to your side while in a flat bed without using bedrails?: A Lot Help needed moving from lying on your back to sitting on the side of a flat bed without using bedrails?: A Lot Help needed moving to and from a bed to a chair (including a wheelchair)?: A Lot Help needed standing up from a chair using your arms (e.g., wheelchair or bedside chair)?: A Lot Help needed to walk in hospital  room?: A Lot Help needed climbing 3-5 steps with a railing? : A Lot 6 Click Score: 12    End of Session Equipment Utilized During Treatment: Gait belt Activity Tolerance: Patient limited by fatigue;Patient limited by pain Patient left: in chair;with call bell/phone within reach;with chair alarm set;with nursing/sitter in room Nurse Communication: Mobility status PT Visit Diagnosis: Difficulty in walking, not elsewhere classified (R26.2);Muscle weakness (generalized) (  M62.81);Pain Pain - Right/Left: Left Pain - part of body: Leg     Time: 0902-0920 PT Time Calculation (min) (ACUTE ONLY): 18 min  Charges:    $Gait Training: 8-22 mins PT General Charges $$ ACUTE PT VISIT: 1 Visit                     Katheryn Leap  PTA Acute  Rehabilitation Services Office M-F          [phone]

## 2024-02-15 NOTE — Progress Notes (Signed)
 [facility]                                                                              Rachel Pierce, is a 89 y.o. female, DOB - Aug 11, 1934, FMW:[id number] Admit date - 02/12/2024    Outpatient Primary MD for the patient is Lucius Krabbe, NP  LOS - 3  days  Chief Complaint  Patient presents with   Hip Pain       Brief summary   Patient is 89 year old female with diabetes mellitus type 2, HTN, CKD stage II, hypothyroidism, HLD, osteoporosis, presented with left leg pain.  Per patient, on Thursday, 3 days PTA she was helping her husband to get up from the ground and pulled her left hip.  Since then she has not been able to ambulate secondary to pain.  Not on anticoagulation or falls NAD, noted to have a temp of 100.7 F, complained of dysuria and foul odor of the urine.  No nausea vomiting, abdominal pain or diarrhea. Flu, RSV, COVID-negative  Assessment & Plan     Left hip pain, left knee pain -On exam, patient reporting severe left knee pain and not the back pain  - MRI of the left hip showed no acute displaced fracture, marked intramuscular edema within the medial left hip throughout the obturator and abductor musculature consistent of severe muscle strain, moderate left hip effusion, bilateral moderate hip osteoarthritis - MRI of the lumbar spine showed grade 3 anterolisthesis at L5-S1 with moderate to severe bilateral foraminal narrowing broad-based disc protrusion at L3-4 resulting in moderate central and right foraminal stenosis, left disc protrusion at L4-5 - Left knee x-ray with no periprostatic fracture or lucency - Continue pain control, - Status post IR left hip injection, feeling better today  UTI - Urine culture with more than 100,000 colonies of E. coli resistant to Rocephin  -Changed to IV Unasyn  while inpatient, change to Macrobid  at discharge  Hyperlipidemia - Continue statin  Diabetes mellitus type 2, NIDDM - Hemoglobin A1c 6.1 CBG  (last 3)  Recent Labs    02/14/24 2056 02/15/24 0725 02/15/24 1233  GLUCAP 238* 137* 131*   Continue sliding scale insulin  while inpatient  Essential hypertension Continue Toprol -XL      Stage II decubitus ulcer (HCC), POA Pressure Injury Documentation: Wound 02/13/24 Pressure Injury Sacrum Upper Stage 1 -  Intact skin with non-blanchable redness of a localized area usually over a bony prominence. (Active)     Wound 02/12/24 2100 Pressure Injury Buttocks Right Unstageable - Full thickness tissue loss in which the base of the injury is covered by slough (yellow, tan, gray, green or brown) and/or eschar (tan, brown or black) in the wound bed. (Active)     Wound 02/13/24 1454 Pressure Injury Buttocks Left;Medial Deep Tissue Pressure Injury - Purple or maroon localized area of discolored intact skin or blood-filled blister due to damage of underlying soft tissue from pressure and/or shear. (Active)  - Wound care consult   Estimated body mass index is 27.59 kg/m as calculated from the following:   Height as of this encounter: 5' 1 (1.549 m).   Weight as of this encounter:  66.2 kg.  Code Status: Full code DVT Prophylaxis:  SCDs Start: 02/12/24 2118   Level of Care: Level of care: Med-Surg Family Communication: Updated patient's son at the bedside on 11/24 Disposition Plan:      Remains inpatient appropriate:   SNF when bed available   Procedures:    Consultants:   Orthopedics  Antimicrobials:   Anti-infectives (From admission, onward)    Start     Dose/Rate Route Frequency Ordered Stop   02/15/24 1000  Ampicillin -Sulbactam (UNASYN ) 3 g in sodium chloride  0.9 % 100 mL IVPB        3 g 200 mL/hr over 30 Minutes Intravenous Every 8 hours 02/15/24 0907     02/13/24 0200  cefTRIAXone  (ROCEPHIN ) 1 g in sodium chloride  0.9 % 100 mL IVPB  Status:  Discontinued        1 g 200 mL/hr over 30 Minutes Intravenous Every 24 hours 02/13/24 0031 02/15/24 0906           Medications  collagenase    Topical Daily   docusate sodium   100 mg Oral BID   insulin  aspart  0-9 Units Subcutaneous TID WC   levothyroxine   88 mcg Oral QAC breakfast   metoprolol  succinate  12.5 mg Oral Daily   nystatin    Topical TID   pantoprazole   40 mg Oral Daily   pravastatin   20 mg Oral Daily   senna  1 tablet Oral BID      Subjective:   Rachel Pierce was seen and examined today.  No acute issues.  Left hip pain improving after injection.    Objective:   Vitals:   02/14/24 0510 02/14/24 1347 02/14/24 2055 02/15/24 0350  BP: 139/70 (!) 145/58 (!) 141/66 135/65  Pulse: 81 71 78 63  Resp: 16 18 17 17   Temp: 98.2 F (36.8 C) 98.3 F (36.8 C) 98.7 F (37.1 C) 97.7 F (36.5 C)  TempSrc: Oral Oral Oral Oral  SpO2: 94% 95% 93% 95%  Weight:      Height:        Intake/Output Summary (Last 24 hours) at 02/15/2024 1304 Last data filed at 02/15/2024 0351 Gross per 24 hour  Intake --  Output 850 ml  Net -850 ml     Wt Readings from Last 3 Encounters:  02/12/24 66.2 kg  01/18/24 66.3 kg  12/23/17 65.8 kg   Physical Exam General: Alert and oriented x 3, NAD Cardiovascular: S1 S2 clear, RRR.  Respiratory: CTAB, no wheezing, rales or rhonchi Gastrointestinal: Soft, nontender, nondistended, NBS Ext: no pedal edema bilaterally, mild improvement in ROM left leg after injection Neuro: no new deficits Psych: Normal affect    Data Reviewed:  I have personally reviewed following labs    CBC Lab Results  Component Value Date   WBC 6.6 02/15/2024   RBC 4.97 02/15/2024   HGB 14.7 02/15/2024   HCT 47.4 (H) 02/15/2024   MCV 95.4 02/15/2024   MCH 29.6 02/15/2024   PLT 248 02/15/2024   MCHC 31.0 02/15/2024   RDW 12.5 02/15/2024     Last metabolic panel Lab Results  Component Value Date   NA 138 02/15/2024   K 4.4 02/15/2024   CL 104 02/15/2024   CO2 21 (L) 02/15/2024   BUN 16 02/15/2024   CREATININE 0.66 02/15/2024   GLUCOSE 157 (H)  02/15/2024   GFRNONAA >60 02/15/2024   GFRAA >60 07/26/2014   CALCIUM 9.7 02/15/2024   PHOS 3.1 02/15/2024   PROT 7.0 02/13/2024  ALBUMIN 3.1 (L) 02/15/2024   BILITOT 0.6 02/13/2024   ALKPHOS 80 02/13/2024   AST 25 02/13/2024   ALT 15 02/13/2024   ANIONGAP 13 02/15/2024    CBG (last 3)  Recent Labs    02/14/24 2056 02/15/24 0725 02/15/24 1233  GLUCAP 238* 137* 131*      Coagulation Profile: No results for input(s): INR, PROTIME in the last 168 hours.   Radiology Studies: I have personally reviewed the imaging studies  DG FL ASP/INJ MAJOR (SHOULDER, HIP, KNEE) Result Date: 02/14/2024 CLINICAL DATA:  Left hip pain. EXAM: LEFT HIP INJECTION UNDER FLUOROSCOPY COMPARISON:  Left hip MRI 02/12/2024 FLUOROSCOPY: Radiation Exposure Index (as provided by the fluoroscopic device): 0.60 mGy Kerma PROCEDURE: The overlying skin was prepped with Betadine, draped in the usual sterile fashion, and infiltrated locally with 1% lidocaine . A 3.5 inch 22 gauge spinal needle was advanced to the lateral aspect of the left femoral head-neck junction. 1 mL of 1% lidocaine  injected easily. Diagnostic injection of a small amount of Isovue-M 200 demonstrated intra-articular spread without intravascular component. Aspiration yielded only 0.5 mL of clear synovial fluid. 80 mg of Depo-Medrol  and 4 mL of 0.25% bupivacaine  were then administered. The needle was removed and a sterile dressing was applied. There was no immediate complication. IMPRESSION: Technically successful left hip injection under fluoroscopy. Electronically Signed   By: Dasie Hamburg M.D.   On: 02/14/2024 15:45       Jerrid Forgette M.D. [facility] 02/15/2024, 1:04 PM  Available via Epic secure chat 7am-7pm After 7 pm, please refer to night coverage provider listed on amion.

## 2024-02-15 NOTE — Progress Notes (Signed)
 Occupational Therapy Treatment Patient Details Name: Rachel Pierce MRN: [id number] DOB: 26-Nov-1934 Today's Date: 02/15/2024   History of present illness Patient is 89 year old female with diabetes mellitus type 2, HTN, CKD stage II, hypothyroidism, HLD, osteoporosis, presented with left leg pain.  Per patient, on Thursday, 3 days PTA she was helping her husband to get up from the ground and pulled her left hip.  Since then she has not been able to ambulate secondary to pain.      MRI of the left hip showed no acute displaced fracture, marked intramuscular edema within the medial left hip throughout the obturator and abductor musculature consistent of severe muscle strain, moderate left hip effusion, bilateral moderate hip osteoarthritis  - MRI of the lumbar spine showed grade 3 anterolisthesis at L5-S1 with moderate to severe bilateral foraminal narrowing broad-based disc protrusion at L3-4 resulting in moderate central and right foraminal stenosis, left disc protrusion at L4-5   OT comments  Pt c/o pain to L knee with movement, overall moving around better. Pt min A to assist to EOB, increased time due to pain. Pt stand with CGA, RW for support. Pt able to ambulate as needed to restroom, set up/CGA for hygiene, able to stand at sink performing hand hygiene, brushing teeth/hair unsupported static standing. Pt ambulated 30 more feet with increased time due to pain. Pt unable to complete LB ADLs at this time due to pain in LLE. Pt would benefit from continued acute OT to maximize functional strength/independence with ADLs, DC plan to postacute rehab <3hrs/day still recommended.       If plan is discharge home, recommend the following:  Assistance with cooking/housework;A lot of help with bathing/dressing/bathroom;A lot of help with walking and/or transfers   Equipment Recommendations  Other (comment) (dfefer)    Recommendations for Other Services      Precautions / Restrictions  Precautions Precautions: Fall Recall of Precautions/Restrictions: Intact Restrictions Weight Bearing Restrictions Per Provider Order: No Other Position/Activity Restrictions: WBAT       Mobility Bed Mobility Overal bed mobility: Needs Assistance Bed Mobility: Supine to Sit, Sit to Supine     Supine to sit: Min assist Sit to supine: Min assist   General bed mobility comments: min A for in/out of bed for LLE    Transfers Overall transfer level: Needs assistance Equipment used: Rolling walker (2 wheels) Transfers: Sit to/from Stand Sit to Stand: Contact guard assist           General transfer comment: CGA for safety, no physical assist     Balance Overall balance assessment: Needs assistance Sitting-balance support: No upper extremity supported, Feet supported Sitting balance-Leahy Scale: Good     Standing balance support: No upper extremity supported, During functional activity Standing balance-Leahy Scale: Fair Standing balance comment: able to stand unsupported, fair static standing, RW for dynamic support                           ADL either performed or assessed with clinical judgement   ADL Overall ADL's : Needs assistance/impaired                                       General ADL Comments: overall set up/CGA for mobility and standing ADLs at sink. Able to stand unsupported performing grooming, perineal hygiene, RW for ambulation.    Extremity/Trunk Assessment Upper Extremity Assessment  Upper Extremity Assessment: Overall WFL for tasks assessed            Vision       Perception     Praxis     Communication Communication Communication: Impaired Factors Affecting Communication: Hearing impaired   Cognition Arousal: Alert Behavior During Therapy: WFL for tasks assessed/performed Cognition: No apparent impairments                               Following commands: Intact        Cueing   Cueing  Techniques: Verbal cues, Gestural cues  Exercises      Shoulder Instructions       General Comments      Pertinent Vitals/ Pain       Pain Assessment Pain Assessment: Faces Faces Pain Scale: Hurts little more Pain Location: LEFT knee (arthritis) Pain Descriptors / Indicators: Aching, Discomfort Pain Intervention(s): Monitored during session  Home Living                                          Prior Functioning/Environment              Frequency  Min 2X/week        Progress Toward Goals  OT Goals(current goals can now be found in the care plan section)  Progress towards OT goals: Progressing toward goals  Acute Rehab OT Goals Patient Stated Goal: to manage pain OT Goal Formulation: With patient Time For Goal Achievement: 02/27/24 Potential to Achieve Goals: Good ADL Goals Pt Will Perform Grooming: with modified independence;standing Pt Will Perform Lower Body Dressing: with modified independence;sit to/from stand Pt Will Transfer to Toilet: with modified independence;regular height toilet;ambulating Pt Will Perform Toileting - Clothing Manipulation and hygiene: with modified independence;sit to/from stand;sitting/lateral leans  Plan      Co-evaluation                 AM-PAC OT 6 Clicks Daily Activity     Outcome Measure   Help from another person eating meals?: None Help from another person taking care of personal grooming?: A Little Help from another person toileting, which includes using toliet, bedpan, or urinal?: A Little Help from another person bathing (including washing, rinsing, drying)?: A Little Help from another person to put on and taking off regular upper body clothing?: A Little Help from another person to put on and taking off regular lower body clothing?: A Little 6 Click Score: 19    End of Session Equipment Utilized During Treatment: Gait belt;Rolling walker (2 wheels)  OT Visit Diagnosis: Unsteadiness on  feet (R26.81);Other abnormalities of gait and mobility (R26.89);History of falling (Z91.81);Pain Pain - Right/Left: Left Pain - part of body: Hip   Activity Tolerance Patient tolerated treatment well   Patient Left in bed;with call bell/phone within reach;with bed alarm set   Nurse Communication Mobility status        Time: 8479-8448 OT Time Calculation (min): 31 min  Charges: OT General Charges $OT Visit: 1 Visit OT Treatments $Self Care/Home Management : 23-37 mins  [address] OTR/L   Rachel Pierce 02/15/2024, 4:02 PM

## 2024-02-16 DIAGNOSIS — E119 Type 2 diabetes mellitus without complications: Secondary | ICD-10-CM | POA: Diagnosis not present

## 2024-02-16 DIAGNOSIS — R509 Fever, unspecified: Secondary | ICD-10-CM | POA: Diagnosis not present

## 2024-02-16 DIAGNOSIS — M25552 Pain in left hip: Secondary | ICD-10-CM | POA: Diagnosis not present

## 2024-02-16 DIAGNOSIS — T148XXA Other injury of unspecified body region, initial encounter: Secondary | ICD-10-CM | POA: Diagnosis not present

## 2024-02-16 LAB — CBC
HCT: 41.1 % (ref 36.0–46.0)
Hemoglobin: 13.5 g/dL (ref 12.0–15.0)
MCH: 29.9 pg (ref 26.0–34.0)
MCHC: 32.8 g/dL (ref 30.0–36.0)
MCV: 90.9 fL (ref 80.0–100.0)
Platelets: 320 K/uL (ref 150–400)
RBC: 4.52 MIL/uL (ref 3.87–5.11)
RDW: 12.5 % (ref 11.5–15.5)
WBC: 10 K/uL (ref 4.0–10.5)
nRBC: 0 % (ref 0.0–0.2)

## 2024-02-16 LAB — GLUCOSE, CAPILLARY
Glucose-Capillary: 117 mg/dL — ABNORMAL HIGH (ref 70–99)
Glucose-Capillary: 110 mg/dL — ABNORMAL HIGH (ref 70–99)

## 2024-02-16 LAB — RENAL FUNCTION PANEL
Albumin: 3.3 g/dL — ABNORMAL LOW (ref 3.5–5.0)
Anion gap: 11 (ref 5–15)
BUN: 21 mg/dL (ref 8–23)
CO2: 25 mmol/L (ref 22–32)
Calcium: 9.3 mg/dL (ref 8.9–10.3)
Chloride: 106 mmol/L (ref 98–111)
Creatinine, Ser: 0.65 mg/dL (ref 0.44–1.00)
GFR, Estimated: >60 mL/min (ref >60)
Glucose, Bld: 141 mg/dL — ABNORMAL HIGH (ref 70–99)
Phosphorus: 2.5 mg/dL (ref 2.5–4.6)
Potassium: 4.2 mmol/L (ref 3.5–5.1)
Sodium: 143 mmol/L (ref 135–145)

## 2024-02-16 MED ORDER — OXYCODONE-ACETAMINOPHEN 5-325 MG PO TABS: 1.0000 | ORAL_TABLET | Freq: Four times a day (QID) | ORAL | 0 refills | Status: AC | PRN

## 2024-02-16 MED ORDER — DOCUSATE SODIUM 100 MG PO CAPS: 100.0000 mg | ORAL_CAPSULE | Freq: Two times a day (BID) | ORAL | Status: AC

## 2024-02-16 MED ORDER — COLLAGENASE 250 UNIT/GM EX OINT: TOPICAL_OINTMENT | Freq: Every day | CUTANEOUS | Status: AC

## 2024-02-16 MED ORDER — NITROFURANTOIN MONOHYD MACRO 100 MG PO CAPS: 100.0000 mg | ORAL_CAPSULE | Freq: Two times a day (BID) | ORAL | Status: AC

## 2024-02-16 MED ORDER — NYSTATIN 100000 UNIT/GM EX POWD: Freq: Three times a day (TID) | CUTANEOUS | Status: AC

## 2024-02-16 MED ORDER — CLONIDINE HCL 0.1 MG PO TABS
0.1000 mg | ORAL_TABLET | Freq: Once | ORAL | Status: AC
  Administered 2024-02-16: 0.1 mg via ORAL
  Filled 2024-02-16: qty 1

## 2024-02-16 MED ORDER — POLYETHYLENE GLYCOL 3350 17 G PO PACK: 17.0000 g | PACK | Freq: Every day | ORAL | Status: AC | PRN

## 2024-02-16 MED ORDER — HYDROCODONE-ACETAMINOPHEN 5-325 MG PO TABS: 1.0000 | ORAL_TABLET | Freq: Four times a day (QID) | ORAL | 0 refills | Status: AC | PRN

## 2024-02-16 MED ORDER — SENNA 8.6 MG PO TABS: 1.0000 | ORAL_TABLET | Freq: Two times a day (BID) | ORAL | Status: AC

## 2024-02-16 MED ORDER — ONDANSETRON HCL 4 MG PO TABS: 4.0000 mg | ORAL_TABLET | Freq: Four times a day (QID) | ORAL | Status: AC | PRN

## 2024-02-16 NOTE — Care Management Important Message (Signed)
 Important Message  Patient Details IM Letter given. Name: Rachel Pierce MRN: [id number] Date of Birth: [birth year]   Important Message Given:  Yes - Medicare IM     Melba Ates 02/16/2024, 8:55 AM

## 2024-02-16 NOTE — Discharge Summary (Signed)
 Physician Discharge Summary   Patient: Rachel Pierce MRN: [id number] DOB: 10-May-1934  Admit date:     02/12/2024  Discharge date: 02/16/24  Discharge Physician: Nydia Distance, MD    PCP: Lucius Krabbe, NP   Recommendations at discharge:   Continue PT OT as tolerated Macrobid  100 mg twice daily for 5 days Wound care per instructions below  Discharge Diagnoses:   Acute left hip pain due to severe muscle strain E. coli UTI   Acquired hypothyroidism   Other hyperlipidemia   Fever   Stage II decubitus ulcer (HCC)   Controlled type 2 diabetes mellitus without complication, without long-term current use of insulin  (HCC)   Essential hypertension   Sciatica of left side     Hospital Course:  Patient is 89 year old female with diabetes mellitus type 2, HTN, CKD stage II, hypothyroidism, HLD, osteoporosis, presented with left leg pain.  Per patient, on Thursday, 3 days PTA she was helping her husband to get up from the ground and pulled her left hip.  Since then she has not been able to ambulate secondary to pain.  Not on anticoagulation or falls NAD, noted to have a temp of 100.7 F, complained of dysuria and foul odor of the urine.  No nausea vomiting, abdominal pain or diarrhea. Flu, RSV, COVID-negative   Assessment and Plan:  Acute left hip pain due to severe muscle strain -On exam, patient reporting severe left knee pain and not the back pain  - MRI of the left hip showed no acute displaced fracture, marked intramuscular edema within the medial left hip throughout the obturator and abductor musculature consistent of severe muscle strain, moderate left hip effusion, bilateral moderate hip osteoarthritis - MRI of the lumbar spine showed grade 3 anterolisthesis at L5-S1 with moderate to severe bilateral foraminal narrowing broad-based disc protrusion at L3-4 resulting in moderate central and right foraminal stenosis, left disc protrusion at L4-5 - Left knee x-ray with no  periprostatic fracture or lucency - Continue pain control, - Status post IR left hip injection on 11/24, feels a lot better with the pain -Continue PT OT, outpatient follow-up with orthopedics in 2 weeks   UTI - Urine culture with more than 100,000 colonies of E. coli resistant to Rocephin , hence was placed on IV Unasyn  - Addition to oral Macrobid  for 5 more days to complete full course   Hyperlipidemia - Continue statin   Diabetes mellitus type 2, NIDDM - Hemoglobin A1c 6.1 Continue carb modified diet   Essential hypertension Continue Toprol -XL         Stage II decubitus ulcer (HCC), POA Pressure Injury Documentation: Wound 02/13/24 Pressure Injury Sacrum Upper Stage 1 -  Intact skin with non-blanchable redness of a localized area usually over a bony prominence. (Active)     Wound 02/12/24 2100 Pressure Injury Buttocks Right Unstageable - Full thickness tissue loss in which the base of the injury is covered by slough (yellow, tan, gray, green or brown) and/or eschar (tan, brown or black) in the wound bed. (Active)     Wound 02/13/24 1454 Pressure Injury Buttocks Left;Medial Deep Tissue Pressure Injury - Purple or maroon localized area of discolored intact skin or blood-filled blister due to damage of underlying soft tissue from pressure and/or shear. (Active)  - Wound care consult and recommendations     Estimated body mass index is 27.59 kg/m as calculated from the following:   Height as of this encounter: 5' 1 (1.549 m).   Weight as of this encounter:  66.2 kg.         Pain control - [place]  Controlled Substance Reporting System database was reviewed. and patient was instructed, not to drive, operate heavy machinery, perform activities at heights, swimming or participation in water activities or provide baby-sitting services while on Pain, Sleep and Anxiety Medications; until their outpatient Physician has advised to do so again. Also recommended to not to take more  than prescribed Pain, Sleep and Anxiety Medications.  Consultants: Orthopedics, IR Procedures performed: Left hip injection under fluoroscopy Disposition: Skilled nursing facility Diet recommendation:  Discharge Diet Orders (From admission, onward)     Start     Ordered   02/16/24 0000  Diet Carb Modified        02/16/24 0833            DISCHARGE MEDICATION: Allergies as of 02/16/2024       Reactions   Codeine Anaphylaxis, Swelling   Latex    RASH    Sulfa Antibiotics Anaphylaxis, Swelling        Medication List     TAKE these medications    acetaminophen  500 MG tablet Commonly known as: TYLENOL  Take 1,000 mg by mouth at bedtime.   ARTIFICIAL TEAR SOLUTION OP Place 1 drop into the right eye daily as needed (dryness).   collagenase  250 UNIT/GM ointment Commonly known as: SANTYL  Apply topically daily. Apply 1/4 thick layer of Santyl  to wound bed of R buttock top with saline moist gauze (see wound care order)   docusate sodium  100 MG capsule Commonly known as: COLACE Take 1 capsule (100 mg total) by mouth 2 (two) times daily.   glucosamine-chondroitin 500-400 MG tablet Take 1 tablet by mouth 3 (three) times daily.   HYDROcodone -acetaminophen  5-325 MG tablet Commonly known as: NORCO/VICODIN Take 1 tablet by mouth every 6 (six) hours as needed for moderate pain (pain score 4-6). What changed: reasons to take this   levothyroxine  88 MCG tablet Commonly known as: SYNTHROID  Take 1 tablet by mouth daily before breakfast.   metoprolol  succinate 25 MG 24 hr tablet Commonly known as: TOPROL -XL Take 12.5 mg by mouth daily.   nitrofurantoin  (macrocrystal-monohydrate) 100 MG capsule Commonly known as: Macrobid  Take 1 capsule (100 mg total) by mouth 2 (two) times daily for 5 days.   nystatin  powder Commonly known as: MYCOSTATIN /NYSTOP  Apply topically 3 (three) times daily. Apply to groin area   omeprazole 20 MG capsule Commonly known as: PRILOSEC Take 20 mg  by mouth daily.   ondansetron  4 MG tablet Commonly known as: ZOFRAN  Take 1 tablet (4 mg total) by mouth every 6 (six) hours as needed for nausea.   oxyCODONE -acetaminophen  5-325 MG tablet Commonly known as: PERCOCET/ROXICET Take 1 tablet by mouth every 6 (six) hours as needed for severe pain (pain score 7-10).   PEPTO BISMOL PO Take by mouth as needed.   polyethylene glycol 17 g packet Commonly known as: MIRALAX  / GLYCOLAX  Take 17 g by mouth daily as needed for mild constipation.   pravastatin  20 MG tablet Commonly known as: PRAVACHOL  Take 20 mg by mouth daily.   predniSONE  10 MG tablet Commonly known as: DELTASONE  Take 1 tablet (10 mg total) by mouth daily with breakfast.   senna 8.6 MG Tabs tablet Commonly known as: SENOKOT Take 1 tablet (8.6 mg total) by mouth 2 (two) times daily.               Discharge Care Instructions  (From admission, onward)  Start     Ordered   02/16/24 0000  Discharge wound care:       Comments: Wound care  Daily      Comments: 1.         Cleanse B medial buttocks wounds with Vashe, do not rinse.  Apply Xeroform gauze  to purple discoloration L buttock.  Apply 1/4 thick layer of Santyl  to wound bed of R buttock top with saline moist gauze, dry gauze and secure with ABD pad and clothe tape or silicone foam whichever is preferred.  2.Cover upper sacral Stage 1 with silicone foam, lift foam daily to assess and replace foam q3 days and prn soiling.   02/16/24 9166            Contact information for follow-up providers     Lucius Krabbe, NP. Schedule an appointment as soon as possible for a visit in 2 week(s).   Specialty: Family Medicine Why: for hospital follow-up Contact information: [address] [place] [address] [phone]         Vernetta Lonni GRADE, MD. Schedule an appointment as soon as possible for a visit in 2 week(s).   Specialty: Orthopedic Surgery Why: for hospital  follow-up Contact information: [address] [place]  [place] [address] [phone]              Contact information for after-discharge care     Destination     [facility] .   Service: Skilled Nursing Contact information: [address] [place]  [address] [phone]                    Discharge Exam: Filed Weights   02/12/24 1313  Weight: 66.2 kg   S: Appears comfortable, pain a lot better, able to move left leg.  No fever chills, chest pain or shortness of breath.  Feels ready to start rehab.  BP (!) 141/57 (BP Location: Right Arm)   Pulse 64   Temp (!) 97.5 F (36.4 C) (Oral)   Resp 16   Ht 5' 1 (1.549 m)   Wt 66.2 kg   SpO2 98%   BMI 27.59 kg/m   Physical Exam General: Alert and oriented x 3, NAD Cardiovascular: S1 S2 clear, RRR.  Respiratory: CTAB, no wheezing, rales or rhonchi Gastrointestinal: Soft, nontender, nondistended, NBS Ext: no pedal edema bilaterally, ROM left leg improving Neuro: no new deficits Psych: Normal affect    Condition at discharge: fair  The results of significant diagnostics from this hospitalization (including imaging, microbiology, ancillary and laboratory) are listed below for reference.   Imaging Studies: DG FL ASP/INJ MAJOR (SHOULDER, HIP, KNEE) Result Date: 02/14/2024 CLINICAL DATA:  Left hip pain. EXAM: LEFT HIP INJECTION UNDER FLUOROSCOPY COMPARISON:  Left hip MRI 02/12/2024 FLUOROSCOPY: Radiation Exposure Index (as provided by the fluoroscopic device): 0.60 mGy Kerma PROCEDURE: The overlying skin was prepped with Betadine, draped in the usual sterile fashion, and infiltrated locally with 1% lidocaine . A 3.5 inch 22 gauge spinal needle was advanced to the lateral aspect of the left femoral head-neck junction. 1 mL of 1% lidocaine  injected easily. Diagnostic injection of a small amount of Isovue-M 200 demonstrated intra-articular spread without intravascular component. Aspiration yielded only  0.5 mL of clear synovial fluid. 80 mg of Depo-Medrol  and 4 mL of 0.25% bupivacaine  were then administered. The needle was removed and a sterile dressing was applied. There was no immediate complication. IMPRESSION: Technically successful left hip injection under fluoroscopy. Electronically Signed   By:  Dasie Hamburg M.D.   On: 02/14/2024 15:45   DG Knee Left Port Result Date: 02/13/2024 EXAM: 1 or 2 VIEW(S) XRAY OF THE KNEE 02/13/2024 10:19:00 AM COMPARISON: 02/12/2024 CLINICAL HISTORY: 89 year old female with pain. FINDINGS: BONES AND JOINTS: Total knee arthroplasty noted. Intact orthopedic hardware without periprosthetic fracture or lucency. No acute fracture. No focal osseous lesion. No joint dislocation. No significant joint effusion. SOFT TISSUES: The soft tissues are unremarkable. IMPRESSION: 1. No periprosthetic fracture or lucency identified about the right knee. Electronically signed by: Helayne Hurst MD 02/13/2024 10:39 AM EST RP Workstation: [id number]   DG Chest 2 View Result Date: 02/12/2024 CLINICAL DATA:  Fever, left hip pain EXAM: CHEST - 2 VIEW COMPARISON:  07/26/2014 FINDINGS: Frontal and lateral views of the chest demonstrate an unremarkable cardiac silhouette. No acute airspace disease, effusion, or pneumothorax. No acute bony abnormalities. IMPRESSION: 1. No acute intrathoracic process. Electronically Signed   By: Ozell Daring M.D.   On: 02/12/2024 20:19   MR HIP LEFT WO CONTRAST Result Date: 02/12/2024 CLINICAL DATA:  Lower back pain radiating to left hip for 1 week, negative x-ray EXAM: MR OF THE LEFT HIP WITHOUT CONTRAST TECHNIQUE: Multiplanar, multisequence MR imaging was performed. No intravenous contrast was administered. COMPARISON:  02/12/2024 FINDINGS: Bones: There are no acute displaced fractures. There are no destructive bony abnormalities. Within the left iliac bone adjacent to the SI joint there is a 2.2 x 4.4 x 2.9 cm region demonstrating heterogeneous increased T2  signal, likely benign etiology such as enchondroma. Articular cartilage and labrum Articular cartilage: There is moderate symmetrical bilateral hip joint space narrowing and osteophyte formation compatible with osteoarthritis. Labrum:  No gross abnormality. Joint or bursal effusion Joint effusion:  Moderate left hip effusion.  No right hip effusion. Bursae: Unremarkable. Muscles and tendons Muscles and tendons: There is abnormal increased T2 signal within the abductor musculature of the medial left hip. Intramuscular edema is seen within the operator internus, obturator externus, and abductor longus and brevis muscles. Findings are compatible with strain. No evidence of muscle tear. Other findings Miscellaneous: Uterus is atrophic, with cystic changes in the endometrium measuring up to 12 mm. Further evaluation with nonemergent outpatient pelvic ultrasound may prove useful. IMPRESSION: 1. No acute displaced fracture. 2. Marked intramuscular edema within the medial left hip throughout the obturator and abductor musculature, consistent with severe muscle strain. No evidence of muscle tear. 3. Moderate left hip effusion. 4. Moderate symmetrical bilateral hip osteoarthritis. Electronically Signed   By: Ozell Daring M.D.   On: 02/12/2024 17:44   MR LUMBAR SPINE WO CONTRAST Result Date: 02/12/2024 EXAM: MRI LUMBAR SPINE 02/12/2024 04:00:43 PM TECHNIQUE: Multiplanar multisequence MRI of the lumbar spine was performed without the administration of intravenous contrast. COMPARISON: Lumbar spine radiographs 02/03/2024. CLINICAL HISTORY: Myelopathy, acute, lumbar spine. Severe low back pain extending into the left lower extremity. FINDINGS: BONES AND ALIGNMENT: Normal vertebral body heights. Bone marrow signal is unremarkable except for edematous endplate marrow changes present posteriorly at L4-L5. L5 pars defects are present bilaterally. Grade 3 anterolisthesis measures 15 mm at L5-S1. Slight retrolisthesis is present  at L4-L5. SPINAL CORD: The conus terminates normally. SOFT TISSUES: No paraspinal mass. L1-L2: Mild disc bulging is present at L1-L2 without significant stenosis. L2-L3: A broad-based disc protrusion and moderate facet hypertrophy are present bilaterally at L2-L3. No significant stenosis is present. L3-L4: A broad-based disc protrusion and advanced facet hypertrophy at L3-L4 result in moderate central and right foraminal stenosis. Left foraminal narrowing is mild. L4-L5:  A leftward disc protrusion at L4-L5 results in moderate left foraminal stenosis. L5-S1: The anterolisthesis at L5-S1 results in moderate to severe foraminal narrowing bilaterally. LEFT KIDNEY: A simple cyst at the upper pole of the left kidney measures 4.3 cm maximally. IMPRESSION: 1. Grade 3 anterolisthesis at L5-S1 with moderate to severe bilateral foraminal narrowing. 2. Broad-based disc protrusion and advanced facet hypertrophy at L3-4 resulting in moderate central and right foraminal stenosis, with mild left foraminal narrowing. 3. Leftward disc protrusion at L4-5 resulting in moderate left foraminal stenosis. Electronically signed by: Lonni Necessary MD 02/12/2024 04:16 PM EST RP Workstation: [id number]   DG Hip Unilat W or Wo Pelvis 2-3 Views Left Result Date: 02/12/2024 CLINICAL DATA:  pain EXAM: DG HIP (WITH OR WITHOUT PELVIS) 2-3V LEFT COMPARISON:  December 23, 2017 FINDINGS: Osteopenia. No acute fracture or dislocation. Joint space narrowing and osteophyte formation of bilateral hips, favored progressed since 2019. Sacrum is obscured by overlapping bowel contents. No area of erosion or osseous destruction. No unexpected radiopaque foreign body. Pelvic phleboliths. IMPRESSION: 1. No acute fracture or dislocation. 2. If there is a persistent clinical concern for nondisplaced hip or pelvic fracture, recommend dedicated pelvic CT or MRI. Electronically Signed   By: Corean Salter M.D.   On: 02/12/2024 14:09   DG Knee Complete 4  Views Left Result Date: 02/12/2024 CLINICAL DATA:  pain EXAM: LEFT KNEE - COMPLETE 4+ VIEW COMPARISON:  February 03, 2024 FINDINGS: Status post knee arthroplasty. Orthopedic hardware is intact and without periprosthetic fracture or lucency. Osteopenia. No acute fracture or dislocation. Soft tissues are unremarkable. IMPRESSION: Status post knee arthroplasty without evidence of hardware complication. Electronically Signed   By: Corean Salter M.D.   On: 02/12/2024 14:07   XR Lumbar Spine 2-3 Views Result Date: 02/07/2024 2 view radiographs of the lumbar spine shows a degenerative scoliosis.  The visualized aspects of the hips are congruent.  There is degenerative changes across the SI joints.  There is calcification of the aorta.  There is advanced degenerative disc disease throughout the lumbar spine with joint space narrowing osteophytic bone spurs.  XR Knee 1-2 Views Left Result Date: 02/07/2024 2 view radiographs of the left knee shows stable total knee arthroplasty.  There is no hardware complications there is status post screw fixation of the medial tibial plateau   Microbiology: Results for orders placed or performed during the hospital encounter of 02/12/24  Urine Culture (for pregnant, neutropenic or urologic patients or patients with an indwelling urinary catheter)     Status: Abnormal   Collection Time: 02/12/24  2:27 AM   Specimen: Urine, Clean Catch  Result Value Ref Range Status   Specimen Description   Final    URINE, CLEAN CATCH Performed at [facility], [address] [place], [place] [address]    Special Requests   Final    NONE Performed at [facility], [address] [place], [place] [address]    Culture >=100,000 COLONIES/mL ESCHERICHIA COLI (A)  Final   Report Status 02/15/2024 FINAL  Final   Organism ID, Bacteria ESCHERICHIA COLI (A)  Final      Susceptibility   Escherichia coli - MIC*    AMPICILLIN  >=32 RESISTANT  Resistant     CEFAZOLIN  (URINE) Value in next row Resistant      >=32 RESISTANTThis is a modified FDA-approved test that has been validated and its performance characteristics determined by the reporting laboratory.  This laboratory is certified under the Clinical Laboratory  Improvement Amendments CLIA as qualified to perform high complexity clinical laboratory testing.    CEFEPIME Value in next row Resistant      >=32 RESISTANTThis is a modified FDA-approved test that has been validated and its performance characteristics determined by the reporting laboratory.  This laboratory is certified under the Clinical Laboratory Improvement Amendments CLIA as qualified to perform high complexity clinical laboratory testing.    ERTAPENEM Value in next row Sensitive      >=32 RESISTANTThis is a modified FDA-approved test that has been validated and its performance characteristics determined by the reporting laboratory.  This laboratory is certified under the Clinical Laboratory Improvement Amendments CLIA as qualified to perform high complexity clinical laboratory testing.    CEFTRIAXONE  Value in next row Resistant      >=32 RESISTANTThis is a modified FDA-approved test that has been validated and its performance characteristics determined by the reporting laboratory.  This laboratory is certified under the Clinical Laboratory Improvement Amendments CLIA as qualified to perform high complexity clinical laboratory testing.    CIPROFLOXACIN Value in next row Resistant      >=32 RESISTANTThis is a modified FDA-approved test that has been validated and its performance characteristics determined by the reporting laboratory.  This laboratory is certified under the Clinical Laboratory Improvement Amendments CLIA as qualified to perform high complexity clinical laboratory testing.    GENTAMICIN Value in next row Sensitive      >=32 RESISTANTThis is a modified FDA-approved test that has been validated and its performance  characteristics determined by the reporting laboratory.  This laboratory is certified under the Clinical Laboratory Improvement Amendments CLIA as qualified to perform high complexity clinical laboratory testing.    NITROFURANTOIN  Value in next row Sensitive      >=32 RESISTANTThis is a modified FDA-approved test that has been validated and its performance characteristics determined by the reporting laboratory.  This laboratory is certified under the Clinical Laboratory Improvement Amendments CLIA as qualified to perform high complexity clinical laboratory testing.    TRIMETH/SULFA Value in next row Sensitive      >=32 RESISTANTThis is a modified FDA-approved test that has been validated and its performance characteristics determined by the reporting laboratory.  This laboratory is certified under the Clinical Laboratory Improvement Amendments CLIA as qualified to perform high complexity clinical laboratory testing.    AMPICILLIN /SULBACTAM Value in next row Sensitive      >=32 RESISTANTThis is a modified FDA-approved test that has been validated and its performance characteristics determined by the reporting laboratory.  This laboratory is certified under the Clinical Laboratory Improvement Amendments CLIA as qualified to perform high complexity clinical laboratory testing.    PIP/TAZO Value in next row Sensitive      <=4 SENSITIVEThis is a modified FDA-approved test that has been validated and its performance characteristics determined by the reporting laboratory.  This laboratory is certified under the Clinical Laboratory Improvement Amendments CLIA as qualified to perform high complexity clinical laboratory testing.    MEROPENEM Value in next row Sensitive      <=4 SENSITIVEThis is a modified FDA-approved test that has been validated and its performance characteristics determined by the reporting laboratory.  This laboratory is certified under the Clinical Laboratory Improvement Amendments CLIA as  qualified to perform high complexity clinical laboratory testing.    * >=100,000 COLONIES/mL ESCHERICHIA COLI  Resp panel by RT-PCR (RSV, Flu A&B, Covid) Anterior Nasal Swab     Status: None   Collection Time: 02/12/24  6:57 PM  Specimen: Anterior Nasal Swab  Result Value Ref Range Status   SARS Coronavirus 2 by RT PCR NEGATIVE NEGATIVE Final    Comment: (NOTE) SARS-CoV-2 target nucleic acids are NOT DETECTED.  The SARS-CoV-2 RNA is generally detectable in upper respiratory specimens during the acute phase of infection. The lowest concentration of SARS-CoV-2 viral copies this assay can detect is 138 copies/mL. A negative result does not preclude SARS-Cov-2 infection and should not be used as the sole basis for treatment or other patient management decisions. A negative result may occur with  improper specimen collection/handling, submission of specimen other than nasopharyngeal swab, presence of viral mutation(s) within the areas targeted by this assay, and inadequate number of viral copies(<138 copies/mL). A negative result must be combined with clinical observations, patient history, and epidemiological information. The expected result is Negative.  Fact Sheet for Patients:  bloggercourse.com  Fact Sheet for Healthcare Providers:  seriousbroker.it  This test is no t yet approved or cleared by the [place]  FDA and  has been authorized for detection and/or diagnosis of SARS-CoV-2 by FDA under an Emergency Use Authorization (EUA). This EUA will remain  in effect (meaning this test can be used) for the duration of the COVID-19 declaration under Section 564(b)(1) of the Act, 21 U.S.C.section 360bbb-3(b)(1), unless the authorization is terminated  or revoked sooner.       Influenza A by PCR NEGATIVE NEGATIVE Final   Influenza B by PCR NEGATIVE NEGATIVE Final    Comment: (NOTE) The Xpert Xpress SARS-CoV-2/FLU/RSV plus assay  is intended as an aid in the diagnosis of influenza from Nasopharyngeal swab specimens and should not be used as a sole basis for treatment. Nasal washings and aspirates are unacceptable for Xpert Xpress SARS-CoV-2/FLU/RSV testing.  Fact Sheet for Patients: bloggercourse.com  Fact Sheet for Healthcare Providers: seriousbroker.it  This test is not yet approved or cleared by the [place]  FDA and has been authorized for detection and/or diagnosis of SARS-CoV-2 by FDA under an Emergency Use Authorization (EUA). This EUA will remain in effect (meaning this test can be used) for the duration of the COVID-19 declaration under Section 564(b)(1) of the Act, 21 U.S.C. section 360bbb-3(b)(1), unless the authorization is terminated or revoked.     Resp Syncytial Virus by PCR NEGATIVE NEGATIVE Final    Comment: (NOTE) Fact Sheet for Patients: bloggercourse.com  Fact Sheet for Healthcare Providers: seriousbroker.it  This test is not yet approved or cleared by the [place]  FDA and has been authorized for detection and/or diagnosis of SARS-CoV-2 by FDA under an Emergency Use Authorization (EUA). This EUA will remain in effect (meaning this test can be used) for the duration of the COVID-19 declaration under Section 564(b)(1) of the Act, 21 U.S.C. section 360bbb-3(b)(1), unless the authorization is terminated or revoked.  Performed at [facility], [address], [place], [place] [address]   Blood culture (routine x 2)     Status: None (Preliminary result)   Collection Time: 02/12/24  8:34 PM   Specimen: BLOOD LEFT ARM  Result Value Ref Range Status   Specimen Description   Final    BLOOD LEFT ARM Performed at [facility], [address] [place], [place] [address]    Special Requests   Final    BOTTLES DRAWN AEROBIC AND ANAEROBIC Blood Culture results  may not be optimal due to an inadequate volume of blood received in culture bottles Performed at [facility], [address], [place], [place] [address]  Culture   Final    NO GROWTH 4 DAYS Performed at [facility], [address] [place], [place] [address]    Report Status PENDING  Incomplete  Blood culture (routine x 2)     Status: None (Preliminary result)   Collection Time: 02/12/24  8:34 PM   Specimen: Site Not Specified; Blood  Result Value Ref Range Status   Specimen Description   Final    SITE NOT SPECIFIED BOTTLES DRAWN AEROBIC AND ANAEROBIC Performed at [facility], [address] [place], [place] [address]    Special Requests   Final    Blood Culture adequate volume Performed at [facility] [address], [place], [place] [address]    Culture   Final    NO GROWTH 4 DAYS Performed at [facility], [address], [place], [place] [address]    Report Status PENDING  Incomplete    Labs: CBC: Recent Labs  Lab 02/12/24 1338 02/13/24 0346 02/14/24 0325 02/15/24 0334 02/16/24 0350  WBC 12.2* 9.1 8.5 6.6 10.0  HGB 15.2* 14.2 13.5 14.7 13.5  HCT 45.7 43.4 40.1 47.4* 41.1  MCV 89.4 90.8 89.5 95.4 90.9  PLT 356 292 285 248 320   Basic Metabolic Panel: Recent Labs  Lab 02/12/24 1338 02/12/24 2127 02/13/24 0346 02/14/24 0648 02/15/24 0334 02/16/24 0350  NA 136  --  138 141 138 143  K 3.6  --  5.3* 4.0 4.4 4.2  CL 99  --  102 107 104 106  CO2 25  --  25 24 21* 25  GLUCOSE 94  --  209* 105* 157* 141*  BUN 21  --  18 21 16 21   CREATININE 0.74  --  0.80 0.75 0.66 0.65  CALCIUM 10.0  --  9.9 9.0 9.7 9.3  MG  --  2.1 2.3  --   --   --   PHOS  --  2.3* 4.4 2.3* 3.1 2.5   Liver Function Tests: Recent Labs  Lab 02/12/24 2127 02/13/24 0346 02/14/24 0648 02/15/24 0334 02/16/24 0350  AST 29 25  --   --   --   ALT 15 15  --   --   --   ALKPHOS 86 80  --   --   --   BILITOT 0.9 0.6   --   --   --   PROT 7.3 7.0  --   --   --   ALBUMIN 3.6 3.4* 3.1* 3.1* 3.3*   CBG: Recent Labs  Lab 02/14/24 2056 02/15/24 0725 02/15/24 1233 02/15/24 1634 02/16/24 0750  GLUCAP 238* 137* 131* 145* 117*    Discharge time spent: greater than 30 minutes.  Signed: Nydia Distance, MD [facility] 02/16/2024

## 2024-02-16 NOTE — Plan of Care (Signed)
 Called [facility] for a Discharge report, Unable to get in touch with the Nurse. Voicemail left with the name and phone number to call back.

## 2024-02-16 NOTE — Plan of Care (Signed)
  Problem: Coping: Goal: Ability to adjust to condition or change in health will improve Outcome: Progressing   Problem: Metabolic: Goal: Ability to maintain appropriate glucose levels will improve Outcome: Progressing   Problem: Skin Integrity: Goal: Risk for impaired skin integrity will decrease Outcome: Progressing   Problem: Pain Managment: Goal: General experience of comfort will improve and/or be controlled Outcome: Progressing   Problem: Safety: Goal: Ability to remain free from injury will improve Outcome: Progressing   Problem: Skin Integrity: Goal: Risk for impaired skin integrity will decrease Outcome: Progressing

## 2024-02-17 LAB — CULTURE, BLOOD (ROUTINE X 2)
Culture: NO GROWTH
Culture: NO GROWTH
Special Requests: ADEQUATE

## 2024-02-18 DIAGNOSIS — M48061 Spinal stenosis, lumbar region without neurogenic claudication: Secondary | ICD-10-CM | POA: Diagnosis not present

## 2024-02-18 DIAGNOSIS — M6281 Muscle weakness (generalized): Secondary | ICD-10-CM | POA: Diagnosis not present

## 2024-02-18 DIAGNOSIS — B9629 Other Escherichia coli [E. coli] as the cause of diseases classified elsewhere: Secondary | ICD-10-CM | POA: Diagnosis not present

## 2024-02-18 DIAGNOSIS — M160 Bilateral primary osteoarthritis of hip: Secondary | ICD-10-CM | POA: Diagnosis not present

## 2024-02-18 DIAGNOSIS — R2689 Other abnormalities of gait and mobility: Secondary | ICD-10-CM | POA: Diagnosis not present

## 2024-02-18 DIAGNOSIS — E119 Type 2 diabetes mellitus without complications: Secondary | ICD-10-CM | POA: Diagnosis not present

## 2024-02-21 DIAGNOSIS — E039 Hypothyroidism, unspecified: Secondary | ICD-10-CM | POA: Diagnosis not present

## 2024-02-21 DIAGNOSIS — N390 Urinary tract infection, site not specified: Secondary | ICD-10-CM | POA: Diagnosis not present

## 2024-02-21 DIAGNOSIS — M6281 Muscle weakness (generalized): Secondary | ICD-10-CM | POA: Diagnosis not present

## 2024-02-21 DIAGNOSIS — E785 Hyperlipidemia, unspecified: Secondary | ICD-10-CM | POA: Diagnosis not present

## 2024-02-21 DIAGNOSIS — K219 Gastro-esophageal reflux disease without esophagitis: Secondary | ICD-10-CM | POA: Diagnosis not present

## 2024-02-21 DIAGNOSIS — I10 Essential (primary) hypertension: Secondary | ICD-10-CM | POA: Diagnosis not present

## 2024-03-02 ENCOUNTER — Ambulatory Visit: Admitting: Orthopedic Surgery

## 2024-03-02 ENCOUNTER — Other Ambulatory Visit

## 2024-03-10 ENCOUNTER — Telehealth: Payer: Self-pay | Admitting: Family

## 2024-03-10 ENCOUNTER — Telehealth: Payer: Self-pay

## 2024-03-10 NOTE — Telephone Encounter (Signed)
 Copied from CRM #[id number]. Topic: Medical Record Request - Other >> Mar 10, 2024 11:29 AM Suzen RAMAN wrote: Reason for CRM: [facility] is re-sending a plan of care for patient. Fax number confirmed. Please contact Deanne to confirm receipt.    Deanne CB#[phone]   Reviewed.

## 2024-03-10 NOTE — Telephone Encounter (Addendum)
 Received

## 2024-03-10 NOTE — Telephone Encounter (Signed)
 [facility] faxed  Home Health Certificate (Order [place] [id number]), to be filled out by provider. [facility]  requested to send it back via Fax within ASAP. Document is located in providers tray at front office.Please advise at [phone].

## 2024-04-13 ENCOUNTER — Telehealth: Payer: Self-pay

## 2024-04-13 NOTE — Telephone Encounter (Signed)
 Copied from CRM [id number]. Topic: General - Other >> Apr 13, 2024  8:42 AM Vena HERO wrote: Reason for CRM: Cathryne from [facility] [phone], called to verify we received an order that was faxed on 03/30/2024. I do not see the order scanned in patient chart so I asked her to send it again just in case. The order number Is [id number] and it is for a discipline add on

## 2024-04-13 NOTE — Telephone Encounter (Signed)
 Awaiting fax.

## 2024-04-27 ENCOUNTER — Telehealth: Payer: Self-pay | Admitting: Family

## 2024-04-27 NOTE — Telephone Encounter (Signed)
Received and placed on providers desk

## 2024-04-27 NOTE — Telephone Encounter (Signed)
 Home Health Certification or Plan of Care Tracking  Is this a Certification or Plan of Care? Yes  [facility]  Order Number:  [id number]  Has charge sheet been attached? Yes  Where has form been placed:  Provider's Box  Faxed to:   [phone].

## 2024-05-23 ENCOUNTER — Ambulatory Visit: Admitting: Family
